# Patient Record
Sex: Male | Born: 1975 | Race: Black or African American | Hispanic: No | Marital: Single | Smoking: Current every day smoker
Health system: Southern US, Community
[De-identification: ages and names within clinical notes are randomized; demographics above are authoritative.]

## PROBLEM LIST (undated history)

## (undated) DIAGNOSIS — I10 Essential (primary) hypertension: Secondary | ICD-10-CM

## (undated) HISTORY — PX: FOOT SURGERY: SHX648

---

## 2008-11-23 ENCOUNTER — Emergency Department: Payer: Self-pay | Admitting: Emergency Medicine

## 2010-06-29 ENCOUNTER — Emergency Department: Payer: Self-pay | Admitting: Emergency Medicine

## 2013-12-23 ENCOUNTER — Emergency Department: Payer: Self-pay | Admitting: Emergency Medicine

## 2013-12-23 LAB — COMPREHENSIVE METABOLIC PANEL
ALT: 86 U/L — AB (ref 12–78)
Albumin: 3.8 g/dL (ref 3.4–5.0)
Alkaline Phosphatase: 86 U/L
Anion Gap: 5 — ABNORMAL LOW (ref 7–16)
BILIRUBIN TOTAL: 0.5 mg/dL (ref 0.2–1.0)
BUN: 11 mg/dL (ref 7–18)
CHLORIDE: 102 mmol/L (ref 98–107)
Calcium, Total: 9.1 mg/dL (ref 8.5–10.1)
Co2: 30 mmol/L (ref 21–32)
Creatinine: 0.87 mg/dL (ref 0.60–1.30)
Glucose: 117 mg/dL — ABNORMAL HIGH (ref 65–99)
OSMOLALITY: 274 (ref 275–301)
Potassium: 3.9 mmol/L (ref 3.5–5.1)
SGOT(AST): 112 U/L — ABNORMAL HIGH (ref 15–37)
SODIUM: 137 mmol/L (ref 136–145)
TOTAL PROTEIN: 7.8 g/dL (ref 6.4–8.2)

## 2013-12-23 LAB — CBC
HCT: 45.8 % (ref 40.0–52.0)
HGB: 14.8 g/dL (ref 13.0–18.0)
MCH: 31.3 pg (ref 26.0–34.0)
MCHC: 32.4 g/dL (ref 32.0–36.0)
MCV: 97 fL (ref 80–100)
Platelet: 177 10*3/uL (ref 150–440)
RBC: 4.74 10*6/uL (ref 4.40–5.90)
RDW: 15 % — AB (ref 11.5–14.5)
WBC: 7.3 10*3/uL (ref 3.8–10.6)

## 2013-12-23 LAB — URINALYSIS, COMPLETE
Bacteria: NONE SEEN
Bilirubin,UR: NEGATIVE
Blood: NEGATIVE
GLUCOSE, UR: NEGATIVE mg/dL (ref 0–75)
Ketone: NEGATIVE
Leukocyte Esterase: NEGATIVE
Nitrite: NEGATIVE
Ph: 6 (ref 4.5–8.0)
Protein: NEGATIVE
Specific Gravity: 1.025 (ref 1.003–1.030)
Squamous Epithelial: <1
WBC UR: NONE SEEN /HPF (ref 0–5)

## 2013-12-23 LAB — DRUG SCREEN, URINE
AMPHETAMINES, UR SCREEN: NEGATIVE (ref ?–1000)
BARBITURATES, UR SCREEN: NEGATIVE (ref ?–200)
Benzodiazepine, Ur Scrn: NEGATIVE (ref ?–200)
Cannabinoid 50 Ng, Ur ~~LOC~~: POSITIVE (ref ?–50)
Cocaine Metabolite,Ur ~~LOC~~: NEGATIVE (ref ?–300)
MDMA (Ecstasy)Ur Screen: NEGATIVE (ref ?–500)
Methadone, Ur Screen: NEGATIVE (ref ?–300)
Opiate, Ur Screen: NEGATIVE (ref ?–300)
Phencyclidine (PCP) Ur S: NEGATIVE (ref ?–25)
Tricyclic, Ur Screen: NEGATIVE (ref ?–1000)

## 2013-12-23 LAB — ACETAMINOPHEN LEVEL: Acetaminophen: <2 ug/mL

## 2013-12-23 LAB — SALICYLATE LEVEL: SALICYLATES, SERUM: 2.2 mg/dL

## 2013-12-23 LAB — ETHANOL

## 2014-07-08 ENCOUNTER — Emergency Department: Payer: Self-pay | Admitting: Emergency Medicine

## 2015-02-12 ENCOUNTER — Emergency Department
Admit: 2015-02-12 | Disposition: A | Discharge status: Home / Self Care | Payer: Self-pay | Admitting: Emergency Medicine

## 2015-03-09 ENCOUNTER — Encounter: Payer: Self-pay | Admitting: Urgent Care

## 2015-03-09 ENCOUNTER — Emergency Department
Admission: EM | Admit: 2015-03-09 | Discharge: 2015-03-09 | Disposition: A | Discharge status: Home / Self Care | Payer: Self-pay | Attending: Emergency Medicine | Admitting: Emergency Medicine

## 2015-03-09 DIAGNOSIS — R197 Diarrhea, unspecified: Secondary | ICD-10-CM

## 2015-03-09 DIAGNOSIS — K580 Irritable bowel syndrome with diarrhea: Secondary | ICD-10-CM | POA: Insufficient documentation

## 2015-03-09 DIAGNOSIS — K589 Irritable bowel syndrome without diarrhea: Secondary | ICD-10-CM

## 2015-03-09 DIAGNOSIS — Z720 Tobacco use: Secondary | ICD-10-CM | POA: Insufficient documentation

## 2015-03-09 DIAGNOSIS — R1084 Generalized abdominal pain: Secondary | ICD-10-CM

## 2015-03-09 DIAGNOSIS — I10 Essential (primary) hypertension: Secondary | ICD-10-CM | POA: Insufficient documentation

## 2015-03-09 HISTORY — DX: Essential (primary) hypertension: I10

## 2015-03-09 LAB — URINALYSIS COMPLETE WITH MICROSCOPIC (ARMC ONLY)
Bilirubin Urine: NEGATIVE
Glucose, UA: NEGATIVE mg/dL
Hgb urine dipstick: NEGATIVE
Ketones, ur: NEGATIVE mg/dL
LEUKOCYTES UA: NEGATIVE
Nitrite: NEGATIVE
PROTEIN: NEGATIVE mg/dL
Specific Gravity, Urine: 1.02 (ref 1.005–1.030)
pH: 6 (ref 5.0–8.0)

## 2015-03-09 LAB — COMPREHENSIVE METABOLIC PANEL
ALK PHOS: 45 U/L (ref 38–126)
ALT: 8 U/L — ABNORMAL LOW (ref 17–63)
ANION GAP: 7 (ref 5–15)
AST: 20 U/L (ref 15–41)
Albumin: 3.1 g/dL — ABNORMAL LOW (ref 3.5–5.0)
BUN: <5 mg/dL — ABNORMAL LOW (ref 6–20)
CO2: 31 mmol/L (ref 22–32)
CREATININE: 0.88 mg/dL (ref 0.61–1.24)
Calcium: 9 mg/dL (ref 8.9–10.3)
Chloride: 102 mmol/L (ref 101–111)
GFR calc Af Amer: >60 mL/min (ref >60)
GLUCOSE: 125 mg/dL — AB (ref 65–99)
Potassium: 2.6 mmol/L — CL (ref 3.5–5.1)
Sodium: 140 mmol/L (ref 135–145)
Total Bilirubin: 0.2 mg/dL — ABNORMAL LOW (ref 0.3–1.2)
Total Protein: 6.1 g/dL — ABNORMAL LOW (ref 6.5–8.1)

## 2015-03-09 LAB — LIPASE, BLOOD: Lipase: 26 U/L (ref 22–51)

## 2015-03-09 LAB — CBC WITH DIFFERENTIAL/PLATELET
BASOS PCT: 0 %
Basophils Absolute: 0 10*3/uL (ref 0–0.1)
Eosinophils Absolute: 0.2 10*3/uL (ref 0–0.7)
Eosinophils Relative: 2 %
HEMATOCRIT: 41.9 % (ref 40.0–52.0)
HEMOGLOBIN: 14.1 g/dL (ref 13.0–18.0)
LYMPHS ABS: 1.7 10*3/uL (ref 1.0–3.6)
LYMPHS PCT: 15 %
MCH: 32 pg (ref 26.0–34.0)
MCHC: 33.6 g/dL (ref 32.0–36.0)
MCV: 95.2 fL (ref 80.0–100.0)
MONO ABS: 2.1 10*3/uL — AB (ref 0.2–1.0)
MONOS PCT: 19 %
NEUTROS ABS: 7.1 10*3/uL — AB (ref 1.4–6.5)
Neutrophils Relative %: 64 %
Platelets: 352 10*3/uL (ref 150–440)
RBC: 4.4 MIL/uL (ref 4.40–5.90)
RDW: 14.1 % (ref 11.5–14.5)
WBC: 11.1 10*3/uL — ABNORMAL HIGH (ref 3.8–10.6)

## 2015-03-09 MED ORDER — HYDROCODONE-ACETAMINOPHEN 5-325 MG PO TABS: 1.0000 | ORAL_TABLET | ORAL | Status: DC | PRN

## 2015-03-09 MED ORDER — POTASSIUM CHLORIDE ER 10 MEQ PO TBCR: 10.0000 meq | EXTENDED_RELEASE_TABLET | Freq: Two times a day (BID) | ORAL | Status: DC

## 2015-03-09 MED ORDER — DICYCLOMINE HCL 10 MG PO CAPS
ORAL_CAPSULE | ORAL | Status: AC
  Administered 2015-03-09: 20 mg via ORAL
  Filled 2015-03-09: qty 2

## 2015-03-09 MED ORDER — DICYCLOMINE HCL 10 MG PO CAPS: 10.0000 mg | ORAL_CAPSULE | Freq: Three times a day (TID) | ORAL | Status: DC

## 2015-03-09 MED ORDER — HYDROCODONE-ACETAMINOPHEN 5-325 MG PO TABS
ORAL_TABLET | ORAL | Status: AC
  Filled 2015-03-09: qty 1

## 2015-03-09 MED ORDER — DICYCLOMINE HCL 10 MG PO CAPS
ORAL_CAPSULE | ORAL | Status: AC
  Filled 2015-03-09: qty 2

## 2015-03-09 MED ORDER — DICYCLOMINE HCL 10 MG PO CAPS
20.0000 mg | ORAL_CAPSULE | Freq: Once | ORAL | Status: AC
  Administered 2015-03-09: 20 mg via ORAL

## 2015-03-09 MED ORDER — HYDROCODONE-ACETAMINOPHEN 5-325 MG PO TABS
1.0000 | ORAL_TABLET | Freq: Once | ORAL | Status: AC
Start: 2015-03-09 — End: 2015-03-09
  Administered 2015-03-09: 1 via ORAL

## 2015-03-09 NOTE — Discharge Instructions (Signed)
Abdominal Pain Many things can cause abdominal pain. Usually, abdominal pain is not caused by a disease and will improve without treatment. It can often be observed and treated at home. Your health care provider will do a physical exam and possibly order blood tests and X-rays to help determine the seriousness of your pain. However, in many cases, more time must pass before a clear cause of the pain can be found. Before that point, your health care provider may not know if you need more testing or further treatment. HOME CARE INSTRUCTIONS  Monitor your abdominal pain for any changes. The following actions may help to alleviate any discomfort you are experiencing:  Only take over-the-counter or prescription medicines as directed by your health care provider.  Do not take laxatives unless directed to do so by your health care provider.  Try a clear liquid diet (broth, tea, or water) as directed by your health care provider. Slowly move to a bland diet as tolerated. SEEK MEDICAL CARE IF:  You have unexplained abdominal pain.  You have abdominal pain associated with nausea or diarrhea.  You have pain when you urinate or have a bowel movement.  You experience abdominal pain that wakes you in the night.  You have abdominal pain that is worsened or improved by eating food.  You have abdominal pain that is worsened with eating fatty foods.  You have a fever. SEEK IMMEDIATE MEDICAL CARE IF:   Your pain does not go away within 2 hours.  You keep throwing up (vomiting).  Your pain is felt only in portions of the abdomen, such as the right side or the left lower portion of the abdomen.  You pass bloody or black tarry stools. MAKE SURE YOU:  Understand these instructions.   Will watch your condition.   Will get help right away if you are not doing well or get worse.  Document Released: 07/12/2005 Document Revised: 10/07/2013 Document Reviewed: 06/11/2013 ExitCare Patient Information  2015 [employer]. This information is not intended to replace advice given to you by your health care provider. Make sure you discuss any questions you have with your health care provider.   Irritable Bowel Syndrome Irritable bowel syndrome (IBS) is caused by a disturbance of normal bowel function and is a common digestive disorder. You may also hear this condition called spastic colon, mucous colitis, and irritable colon. There is no cure for IBS. However, symptoms often gradually improve or disappear with a good diet, stress management, and medicine. This condition usually appears in late adolescence or early adulthood. Women develop it twice as often as men. CAUSES  After food has been digested and absorbed in the small intestine, waste material is moved into the large intestine, or colon. In the colon, water and salts are absorbed from the undigested products coming from the small intestine. The remaining residue, or fecal material, is held for elimination. Under normal circumstances, gentle, rhythmic contractions of the bowel walls push the fecal material along the colon toward the rectum. In IBS, however, these contractions are irregular and poorly coordinated. The fecal material is either retained too long, resulting in constipation, or expelled too soon, producing diarrhea. SIGNS AND SYMPTOMS  The most common symptom of IBS is abdominal pain. It is often in the lower left side of the abdomen, but it may occur anywhere in the abdomen. The pain comes from spasms of the bowel muscles happening too much and from the buildup of gas and fecal material in the colon.   pain:  Can range from sharp abdominal cramps to a dull, continuous ache.  Often worsens soon after eating.  Is often relieved by having a bowel movement or passing gas. Abdominal pain is usually accompanied by constipation, but it may also produce diarrhea. The diarrhea often occurs right after a meal or upon waking up in the  morning. The stools are often soft, watery, and flecked with mucus. Other symptoms of IBS include:  Bloating.  Loss of appetite.  Heartburn.  Backache.  Dull pain in the arms or shoulders.  Nausea.  Burping.  Vomiting.  Gas. IBS may also cause symptoms that are unrelated to the digestive system, such as:  Fatigue.  Headaches.  Anxiety.  Shortness of breath.  Trouble concentrating.  Dizziness. These symptoms tend to come and go. DIAGNOSIS  The symptoms of IBS may seem like symptoms of other, more serious digestive disorders. Your health care provider may want to perform tests to exclude these disorders.  TREATMENT Many medicines are available to help correct bowel function or relieve bowel spasms and abdominal pain. Among the medicines available are:  Laxatives for severe constipation and to help restore normal bowel habits.  Specific antidiarrheal medicines to treat severe or lasting diarrhea.  Antispasmodic agents to relieve intestinal cramps. Your health care provider may also decide to treat you with a mild tranquilizer or sedative during unusually stressful periods in your life. Your health care provider may also prescribe antidepressant medicine. The use of this medicine has been shown to reduce pain and other symptoms of IBS. Remember that if any medicine is prescribed for you, you should take it exactly as directed. Make sure your health care provider knows how well it worked for you. HOME CARE INSTRUCTIONS   Take all medicines as directed by your health care provider.  Avoid foods that are high in fat or oils, such as heavy cream, butter, frankfurters, sausage, and other fatty meats.  Avoid foods that make you go to the bathroom, such as fruit, fruit juice, and dairy products.  Cut out carbonated drinks, chewing gum, and "gassy" foods such as beans and cabbage. This may help relieve bloating and burping.  Eat foods with bran, and drink plenty of liquids  with the bran foods. This helps relieve constipation.  Keep track of what foods seem to bring on your symptoms.  Avoid emotionally charged situations or circumstances that produce anxiety.  Start or continue exercising.  Get plenty of rest and sleep. Document Released: 10/02/2005 Document Revised: 10/07/2013 Document Reviewed: 05/22/2008 [facility] Patient Information 2015 [place], [place]. This information is not intended to replace advice given to you by your health care provider. Make sure you discuss any questions you have with your health care provider.      As we have discussed please take your medications as prescribed. Please also taken over-the-counter fiber supplement such as Metamucil to help bulk your stools and decreased your diarrhea. Please follow-up with GI medicine at the number provided above if symptoms do not resolve within 3-5 days. Return to the emergency department for any increased pain, fever, or any other personally concerning symptoms.

## 2015-03-09 NOTE — ED Notes (Signed)
Stomach pain with diarrhea.off and on for about 1 mo

## 2015-03-09 NOTE — ED Notes (Signed)
Patient presents with c/o mid-abdominal pain x 1 month. (+) intermittent diarrhea. Patient denies N/V. Reports that he feels as if it is getting worse.

## 2015-03-09 NOTE — ED Provider Notes (Signed)
[facility] Emergency Department Provider Note  Time seen: 6:46 PM  I have reviewed the triage vital signs and the nursing notes.   HISTORY  Chief Complaint Abdominal Pain    HPI Emelia Loronheodore L Renegar is a 39 y.o. male with a past medical history of hypertension who presents the emergency department for abdominal pain and diarrhea. According to the patient the past 1 month he has had intermittent abdominal pains which she describes as a feeling that he has to immediately have a bowel movement. He goes to the bathroom a small amount of diarrhea will come out. Patient states this happens between 5-8 times per day, and has not been improving so he came to the ER. Denies any black/bloody stool. Denies nausea/vomiting, dysuria, fevers. Patient states between bowel movements he has absolutely no abdominal pain present since currently he has no abdominal pain.    Past Medical History  Diagnosis Date  . Hypertension     There are no active problems to display for this patient.   Past Surgical History  Procedure Laterality Date  . Foot surgery      No current outpatient prescriptions on file.  Allergies Shellfish allergy and Iodine  No family history on file.  Social History History  Substance Use Topics  . Smoking status: Current Every Day Smoker  . Smokeless tobacco: Not on file  . Alcohol Use: Yes    Review of Systems Constitutional: Negative for fever. Cardiovascular: Negative for chest pain. Respiratory: Negative for shortness of breath. Gastrointestinal: Intermittent abdominal pains, and diarrhea. No nausea or vomiting.. Genitourinary: Negative for dysuria. Neurological: Negative for headaches, focal weakness or numbness.  10-point ROS otherwise negative.  ____________________________________________   PHYSICAL EXAM:  VITAL SIGNS: ED Triage Vitals  Enc Vitals Group     BP 03/09/15 1824 125/89 mmHg     Pulse Rate 03/09/15 1824 72   Resp 03/09/15 1824 18     Temp 03/09/15 1824 98.7 F (37.1 C)     Temp Source 03/09/15 1824 Oral     SpO2 03/09/15 1824 100 %     Weight 03/09/15 1824 155 lb (70.308 kg)     Height 03/09/15 1824 6\' 2"  (1.88 m)     Head Cir --      Peak Flow --      Pain Score 03/09/15 1738 6     Pain Loc --      Pain Edu? --      Excl. in GC? --     Constitutional: Alert and oriented. Well appearing and in no distress. ENT   Mouth/Throat: Mucous membranes are moist. Cardiovascular: Normal rate, regular rhythm. No murmurs, rubs, or gallops. Respiratory: Normal respiratory effort without tachypnea nor retractions. Breath sounds are clear  Gastrointestinal: Soft and nontender. No distention.   Musculoskeletal: Nontender with normal range of motion in all extremities Neurologic:  Normal speech and language. No gross focal neurologic deficits Skin:  Skin is warm, dry and intact.  Psychiatric: Mood and affect are normal. Speech and behavior are normal.  ____________________________________________   INITIAL IMPRESSION / ASSESSMENT AND PLAN / ED COURSE  Pertinent labs & imaging results that were available during my care of the patient were reviewed by me and considered in my medical decision making (see chart for details).  Patient with 1 month of intermittent abdominal pains and loose bowel movements. Denies any straining, or hard stool. Patient's symptoms concerning for possible IBS. We will check labs, attempt to treat with Bentyl  for symptom relief, and monitor in the emergency department.   ----------------------------------------- 9:26 PM on 03/09/2015 -----------------------------------------  Labs show low potassium, likely secondary to diarrhea. We will place the patient on potassium supplements. I discussed with the patient over-the-counter Metamucil/fiber supplements debulk his stool, patient is agreeable to this plan. We will discharge on Bentyl, as well as as needed Norco. The patient  is a follow-up with a primary care doctor within one week, and is to return to the emergency department if symptoms worsen. Patient asking to be discharged home as he needs to pick up his mother, we will discharge him at this time.   ____________________________________________   FINAL CLINICAL IMPRESSION(S) / ED DIAGNOSES  Abdominal pain Diarrhea   Minna Antis, MD 03/09/15 2127

## 2015-10-26 ENCOUNTER — Other Ambulatory Visit: Payer: Self-pay

## 2015-10-26 ENCOUNTER — Emergency Department
Admission: EM | Admit: 2015-10-26 | Discharge: 2015-10-26 | Disposition: A | Discharge status: Home / Self Care | Payer: Self-pay | Attending: Emergency Medicine | Admitting: Emergency Medicine

## 2015-10-26 ENCOUNTER — Emergency Department: Payer: Self-pay

## 2015-10-26 DIAGNOSIS — F17200 Nicotine dependence, unspecified, uncomplicated: Secondary | ICD-10-CM | POA: Insufficient documentation

## 2015-10-26 DIAGNOSIS — F1210 Cannabis abuse, uncomplicated: Secondary | ICD-10-CM | POA: Insufficient documentation

## 2015-10-26 DIAGNOSIS — R42 Dizziness and giddiness: Secondary | ICD-10-CM | POA: Insufficient documentation

## 2015-10-26 DIAGNOSIS — I10 Essential (primary) hypertension: Secondary | ICD-10-CM | POA: Insufficient documentation

## 2015-10-26 DIAGNOSIS — R55 Syncope and collapse: Secondary | ICD-10-CM | POA: Insufficient documentation

## 2015-10-26 LAB — CBC
HEMATOCRIT: 46.5 % (ref 40.0–52.0)
Hemoglobin: 15.5 g/dL (ref 13.0–18.0)
MCH: 30 pg (ref 26.0–34.0)
MCHC: 33.4 g/dL (ref 32.0–36.0)
MCV: 89.9 fL (ref 80.0–100.0)
PLATELETS: 339 10*3/uL (ref 150–440)
RBC: 5.17 MIL/uL (ref 4.40–5.90)
RDW: 14.6 % — ABNORMAL HIGH (ref 11.5–14.5)
WBC: 6 10*3/uL (ref 3.8–10.6)

## 2015-10-26 LAB — BASIC METABOLIC PANEL
Anion gap: 7 (ref 5–15)
BUN: 11 mg/dL (ref 6–20)
CALCIUM: 9.6 mg/dL (ref 8.9–10.3)
CO2: 34 mmol/L — ABNORMAL HIGH (ref 22–32)
CREATININE: 0.86 mg/dL (ref 0.61–1.24)
Chloride: 102 mmol/L (ref 101–111)
GFR calc Af Amer: >60 mL/min (ref >60)
GLUCOSE: 68 mg/dL (ref 65–99)
Potassium: 4 mmol/L (ref 3.5–5.1)
SODIUM: 143 mmol/L (ref 135–145)

## 2015-10-26 LAB — URINALYSIS COMPLETE WITH MICROSCOPIC (ARMC ONLY)
BILIRUBIN URINE: NEGATIVE
Bacteria, UA: NONE SEEN
GLUCOSE, UA: NEGATIVE mg/dL
HGB URINE DIPSTICK: NEGATIVE
Ketones, ur: NEGATIVE mg/dL
Leukocytes, UA: NEGATIVE
NITRITE: NEGATIVE
Protein, ur: NEGATIVE mg/dL
Specific Gravity, Urine: 1.02 (ref 1.005–1.030)
Squamous Epithelial / LPF: NONE SEEN
pH: 7 (ref 5.0–8.0)

## 2015-10-26 LAB — TROPONIN I: Troponin I: 0.04 ng/mL — ABNORMAL HIGH (ref <0.031)

## 2015-10-26 LAB — URINE DRUG SCREEN, QUALITATIVE (ARMC ONLY)
AMPHETAMINES, UR SCREEN: NOT DETECTED
Barbiturates, Ur Screen: NOT DETECTED
Benzodiazepine, Ur Scrn: NOT DETECTED
COCAINE METABOLITE, UR ~~LOC~~: NOT DETECTED
Cannabinoid 50 Ng, Ur ~~LOC~~: POSITIVE — AB
MDMA (Ecstasy)Ur Screen: NOT DETECTED
Methadone Scn, Ur: NOT DETECTED
OPIATE, UR SCREEN: NOT DETECTED
Phencyclidine (PCP) Ur S: NOT DETECTED
Tricyclic, Ur Screen: NOT DETECTED

## 2015-10-26 NOTE — Discharge Instructions (Signed)

## 2015-10-26 NOTE — ED Provider Notes (Signed)
[facility]     Time seen: ----------------------------------------- 12:06 PM on 10/26/2015 -----------------------------------------    I have reviewed the triage vital signs and the nursing notes.   HISTORY  Chief Complaint Dizziness and Loss of Consciousness    HPI Eduardo King is a 40 y.o. male who presents to ER for intermittent dizziness and syncopal episodes. Patient has had several events where he is completely at rest and not exerting himself and he subsequently passes out. Patient denies any recent illness, states he has a history of IBS. Patient states he exercises normally without any dizziness, shortness of breath or palpitations.   Past Medical History  Diagnosis Date  . Hypertension     There are no active problems to display for this patient.   Past Surgical History  Procedure Laterality Date  . Foot surgery      Allergies Shellfish allergy and Iodine  Social History Social History  Substance Use Topics  . Smoking status: Current Every Day Smoker  . Smokeless tobacco: None  . Alcohol Use: Yes    Review of Systems Constitutional: Negative for fever. Eyes: Negative for visual changes. ENT: Negative for sore throat. Cardiovascular: Negative for chest pain. Respiratory: Negative for shortness of breath. Gastrointestinal: Negative for abdominal pain, vomiting and diarrhea. Genitourinary: Negative for dysuria. Musculoskeletal: Negative for back pain. Skin: Negative for rash. Neurological: Negative for headaches, focal weakness or numbness.  10-point ROS otherwise negative.  ____________________________________________   PHYSICAL EXAM:  VITAL SIGNS: ED Triage Vitals  Enc Vitals Group     BP 10/26/15 1005 141/91 mmHg     Pulse Rate 10/26/15 1005 78     Resp 10/26/15 1005 16     Temp 10/26/15 1005 98.2 F (36.8 C)     Temp Source 10/26/15 1005 Oral     SpO2 10/26/15 1005  99 %     Weight 10/26/15 1005 155 lb (70.308 kg)     Height 10/26/15 1005 6\' 2"  (1.88 m)     Head Cir --      Peak Flow --      Pain Score 10/26/15 1006 0     Pain Loc --      Pain Edu? --      Excl. in GC? --     Constitutional: Alert and oriented. Well appearing and in no distress. Eyes: Conjunctivae are normal. PERRL. Normal extraocular movements. ENT   Head: Normocephalic and atraumatic.   Nose: No congestion/rhinnorhea.   Mouth/Throat: Mucous membranes are moist.   Neck: No stridor. Cardiovascular: Normal rate, regular rhythm. Normal and symmetric distal pulses are present in all extremities. No murmurs, rubs, or gallops. Respiratory: Normal respiratory effort without tachypnea nor retractions. Breath sounds are clear and equal bilaterally. No wheezes/rales/rhonchi. Gastrointestinal: Soft and nontender. No distention. No abdominal bruits.  Musculoskeletal: Nontender with normal range of motion in all extremities. No joint effusions.  No lower extremity tenderness nor edema. Neurologic:  Normal speech and language. No gross focal neurologic deficits are appreciated. Speech is normal. No gait instability. Skin:  Skin is warm, dry and intact. No rash noted. Psychiatric: Mood and affect are normal. Speech and behavior are normal. Patient exhibits appropriate insight and judgment. ____________________________________________  EKG: Interpreted by me. Normal sinus rhythm with a rate of 73 bpm, normal PR interval, normal QRS, normal QT interval. Possible LVH.  ____________________________________________  ED COURSE:  Pertinent labs & imaging results that were available during my care of the patient  were reviewed by me and considered in my medical decision making (see chart for details). Patient with nonspecific syncopal events. We'll check basic labs were evaluated. ____________________________________________    LABS (pertinent positives/negatives)  Labs Reviewed   BASIC METABOLIC PANEL - Abnormal; Notable for the following:    CO2 34 (*)    All other components within normal limits  CBC - Abnormal; Notable for the following:    RDW 14.6 (*)    All other components within normal limits  URINALYSIS COMPLETEWITH MICROSCOPIC ([facility] ONLY) - Abnormal; Notable for the following:    Color, Urine YELLOW (*)    APPearance CLEAR (*)    All other components within normal limits  URINE DRUG SCREEN, QUALITATIVE ([facility] ONLY) - Abnormal; Notable for the following:    Cannabinoid 50 Ng, Ur [place] POSITIVE (*)    All other components within normal limits  TROPONIN I - Abnormal; Notable for the following:    Troponin I 0.04 (*)    All other components within normal limits    RADIOLOGY Images were viewed by me  Chest x-ray  IMPRESSION: Negative portable chest. ____________________________________________  FINAL ASSESSMENT AND PLAN  Syncope  Plan: Patient with labs and imaging as dictated above. Patient with history of syncopal events. He currently feels fine, troponin slightly elevated. I discussed with she context, who wants to perform echocardiogram in the office. Patient agrees with plan, will go to [facility] office for further evaluation at this time.   Emily Filbert, MD   Emily Filbert, MD 10/26/15 [phone]

## 2015-10-26 NOTE — ED Notes (Signed)
Pt c/o intermittent dizziness, states he has a syncopal episode over the weekend.. States he has intermittent HA, denies dizziness or HA today..Marland Kitchen

## 2016-04-13 ENCOUNTER — Emergency Department
Admission: EM | Admit: 2016-04-13 | Discharge: 2016-04-13 | Disposition: A | Discharge status: Home / Self Care | Payer: Self-pay | Attending: Emergency Medicine | Admitting: Emergency Medicine

## 2016-04-13 DIAGNOSIS — Z91013 Allergy to seafood: Secondary | ICD-10-CM | POA: Insufficient documentation

## 2016-04-13 DIAGNOSIS — Z79899 Other long term (current) drug therapy: Secondary | ICD-10-CM | POA: Insufficient documentation

## 2016-04-13 DIAGNOSIS — I10 Essential (primary) hypertension: Secondary | ICD-10-CM | POA: Insufficient documentation

## 2016-04-13 DIAGNOSIS — F17200 Nicotine dependence, unspecified, uncomplicated: Secondary | ICD-10-CM | POA: Insufficient documentation

## 2016-04-13 DIAGNOSIS — H109 Unspecified conjunctivitis: Secondary | ICD-10-CM | POA: Insufficient documentation

## 2016-04-13 DIAGNOSIS — H1033 Unspecified acute conjunctivitis, bilateral: Secondary | ICD-10-CM

## 2016-04-13 MED ORDER — POLYMYXIN B-TRIMETHOPRIM 10000-0.1 UNIT/ML-% OP SOLN: 2.0000 [drp] | Freq: Four times a day (QID) | OPHTHALMIC | Status: DC

## 2016-04-13 NOTE — ED Notes (Signed)
Pt reports that he bilat eyes with pain and itching - eyes having been draining clear to yellow mucus that he has been cleaning off all day

## 2016-04-13 NOTE — ED Notes (Signed)
Pt in with co bilat eye itching no redness noted at this time.

## 2016-04-13 NOTE — ED Provider Notes (Signed)
[facility] Emergency Department Provider Note  ____________________________________________  Time seen: Approximately 7:53 PM  I have reviewed the triage vital signs and the nursing notes.   HISTORY  Chief Complaint Eye Problem    HPI Eduardo King is a 40 y.o. male who presents emergency department complaining of bilateral eye redness, itching, pain, and purulent drainage. Patient states that symptoms have been ongoing 1 day. Patient denies any known contact with other people with pink eye. Patient denies any nasal congestion, ear pain, sore throat. He has not tried any medications for this complaint prior to arrival. Patient does not wear glasses or contacts. He denies any trauma to eyes.   Past Medical History  Diagnosis Date  . Hypertension     There are no active problems to display for this patient.   Past Surgical History  Procedure Laterality Date  . Foot surgery      Current Outpatient Rx  Name  Route  Sig  Dispense  Refill  . EXPIRED: dicyclomine (BENTYL) 10 MG capsule   Oral   Take 1 capsule (10 mg total) by mouth 3 (three) times daily before meals.   56 capsule   0   . HYDROcodone-acetaminophen (NORCO/VICODIN) 5-325 MG per tablet   Oral   Take 1 tablet by mouth every 4 (four) hours as needed for moderate pain.   15 tablet   0   . potassium chloride (K-DUR) 10 MEQ tablet   Oral   Take 1 tablet (10 mEq total) by mouth 2 (two) times daily.   60 tablet   0   . trimethoprim-polymyxin b (POLYTRIM) ophthalmic solution   Both Eyes   Place 2 drops into both eyes every 6 (six) hours.   10 mL   0     Allergies Shellfish allergy and Iodine  No family history on file.  Social History Social History  Substance Use Topics  . Smoking status: Current Every Day Smoker  . Smokeless tobacco: Not on file  . Alcohol Use: Yes     Review of Systems  Constitutional: No fever/chills Eyes: No visual changes.Positive for  conjunctival erythema. Positive urine discharge ENT: No upper respiratory complaints. Cardiovascular: no chest pain. Respiratory: no cough. No SOB. Gastrointestinal: No abdominal pain.  No nausea, no vomiting.   Musculoskeletal: Negative for musculoskeletal pain. Skin: Negative for rash, abrasions, lacerations, ecchymosis. Neurological: Negative for headaches, focal weakness or numbness. 10-point ROS otherwise negative.  ____________________________________________   PHYSICAL EXAM:  VITAL SIGNS: ED Triage Vitals  Enc Vitals Group     BP 04/13/16 1915 183/109 mmHg     Pulse Rate 04/13/16 1914 80     Resp 04/13/16 1914 18     Temp 04/13/16 1914 98.4 F (36.9 C)     Temp Source 04/13/16 1914 Oral     SpO2 04/13/16 1914 95 %     Weight 04/13/16 1914 155 lb (70.308 kg)     Height 04/13/16 1914 6\' 2"  (1.88 m)     Head Cir --      Peak Flow --      Pain Score 04/13/16 1932 9     Pain Loc --      Pain Edu? --      Excl. in GC? --      Constitutional: Alert and oriented. Well appearing and in no acute distress. Eyes: Conjunctivae are Erythematous. Purulent drainage is noted bilateral lower eyelashes. Funduscopic exam reveals good red reflex, vasculature and optic disc are  unremarkable for acute abnormality.Marland Kitchen. PERRL. EOMI. Head: Atraumatic. ENT:      Ears: He sees and TMs are unremarkable bilaterally      Nose: No congestion/rhinnorhea.      Mouth/Throat: Mucous membranes are moist.  Neck: No stridor.    Cardiovascular: Normal rate, regular rhythm. Normal S1 and S2.  Good peripheral circulation. Respiratory: Normal respiratory effort without tachypnea or retractions. Lungs CTAB. Good air entry to the bases with no decreased or absent breath sounds. Musculoskeletal: Full range of motion to all extremities. No gross deformities appreciated. Neurologic:  Normal speech and language. No gross focal neurologic deficits are appreciated.  Skin:  Skin is warm, dry and intact. No rash  noted. Psychiatric: Mood and affect are normal. Speech and behavior are normal. Patient exhibits appropriate insight and judgement.   ____________________________________________   LABS (all labs ordered are listed, but only abnormal results are displayed)  Labs Reviewed - No data to display ____________________________________________  EKG   ____________________________________________  RADIOLOGY   No results found.  ____________________________________________    PROCEDURES  Procedure(s) performed:       Medications - No data to display   ____________________________________________   INITIAL IMPRESSION / ASSESSMENT AND PLAN / ED COURSE  Pertinent labs & imaging results that were available during my care of the patient were reviewed by me and considered in my medical decision making (see chart for details).  Patient's diagnosis is consistent with acute bacterial conjunctivitis bilaterally.. Patient will be discharged home with prescriptions for antibiotic eyedrops. Patient is to follow up with primary care provider as needed or otherwise directed. Patient is given ED precautions to return to the ED for any worsening or new symptoms.     ____________________________________________  FINAL CLINICAL IMPRESSION(S) / ED DIAGNOSES  Final diagnoses:  Acute bacterial conjunctivitis of both eyes      NEW MEDICATIONS STARTED DURING THIS VISIT:  Discharge Medication List as of 04/13/2016  7:47 PM    START taking these medications   Details  trimethoprim-polymyxin b (POLYTRIM) ophthalmic solution Place 2 drops into both eyes every 6 (six) hours., Starting 04/13/2016, Until Discontinued, Print            This chart was dictated using voice recognition software/Dragon. Despite best efforts to proofread, errors can occur which can change the meaning. Any change was purely unintentional.    Racheal PatchesJonathan D Avabella Wailes, PA-C 04/13/16 2001  Minna AntisKevin Paduchowski,  MD 04/14/16 2255

## 2016-04-13 NOTE — Discharge Instructions (Signed)
Bacterial Conjunctivitis °Bacterial conjunctivitis, commonly called pink eye, is an inflammation of the clear membrane that covers the white part of the eye (conjunctiva). The inflammation can also happen on the underside of the eyelids. The blood vessels in the conjunctiva become inflamed, causing the eye to become red or pink. Bacterial conjunctivitis may spread easily from one eye to another and from person to person (contagious).  °CAUSES  °Bacterial conjunctivitis is caused by bacteria. The bacteria may come from your own skin, your upper respiratory tract, or from someone else with bacterial conjunctivitis. °SYMPTOMS  °The normally white color of the eye or the underside of the eyelid is usually pink or red. The pink eye is usually associated with irritation, tearing, and some sensitivity to light. Bacterial conjunctivitis is often associated with a thick, yellowish discharge from the eye. The discharge may turn into a crust on the eyelids overnight, which causes your eyelids to stick together. If a discharge is present, there may also be some blurred vision in the affected eye. °DIAGNOSIS  °Bacterial conjunctivitis is diagnosed by your caregiver through an eye exam and the symptoms that you report. Your caregiver looks for changes in the surface tissues of your eyes, which may point to the specific type of conjunctivitis. A sample of any discharge may be collected on a cotton-tip swab if you have a severe case of conjunctivitis, if your cornea is affected, or if you keep getting repeat infections that do not respond to treatment. The sample will be sent to a lab to see if the inflammation is caused by a bacterial infection and to see if the infection will respond to antibiotic medicines. °TREATMENT  °1. Bacterial conjunctivitis is treated with antibiotics. Antibiotic eyedrops are most often used. However, antibiotic ointments are also available. Antibiotics pills are sometimes used. Artificial tears or eye  washes may ease discomfort. °HOME CARE INSTRUCTIONS  °1. To ease discomfort, apply a cool, clean washcloth to your eye for 10-20 minutes, 3-4 times a day. °2. Gently wipe away any drainage from your eye with a warm, wet washcloth or a cotton ball. °3. Wash your hands often with soap and water. Use paper towels to dry your hands. °4. Do not share towels or washcloths. This may spread the infection. °5. Change or wash your pillowcase every day. °6. You should not use eye makeup until the infection is gone. °7. Do not operate machinery or drive if your vision is blurred. °8. Stop using contact lenses. Ask your caregiver how to sterilize or replace your contacts before using them again. This depends on the type of contact lenses that you use. °9. When applying medicine to the infected eye, do not touch the edge of your eyelid with the eyedrop bottle or ointment tube. °SEEK IMMEDIATE MEDICAL CARE IF:  °· Your infection has not improved within 3 days after beginning treatment. °· You had yellow discharge from your eye and it returns. °· You have increased eye pain. °· Your eye redness is spreading. °· Your vision becomes blurred. °· You have a fever or persistent symptoms for more than 2-3 days. °· You have a fever and your symptoms suddenly get worse. °· You have facial pain, redness, or swelling. °MAKE SURE YOU:  °· Understand these instructions. °· Will watch your condition. °· Will get help right away if you are not doing well or get worse. °  °This information is not intended to replace advice given to you by your health care provider. Make sure you   discuss any questions you have with your health care provider. °  °Document Released: 10/02/2005 Document Revised: 10/23/2014 Document Reviewed: 03/04/2012 °Elsevier Interactive Patient Education ©2016 [employer] ° °How to Use Eye Drops and Eye Ointments °HOW TO APPLY EYE DROPS °Follow these steps when applying eye drops: °2. Wash your hands. °3. Tilt your head  back. °4. Put a finger under your eye and use it to gently pull your lower lid downward. Keep that finger in place. °5. Using your other hand, hold the dropper between your thumb and index finger. °6. Position the dropper just over the edge of the lower lid. Hold it as close to your eye as you can without touching the dropper to your eye. °7. Steady your hand. One way to do this is to lean your index finger against your brow. °8. Look up. °9. Slowly and gently squeeze one drop of medicine into your eye. °10. Close your eye. °11. Place a finger between your lower eyelid and your nose. Press gently for 2 minutes. This increases the amount of time that the medicine is exposed to the eye. It also reduces side effects that can develop if the drop gets into the bloodstream through the nose. °HOW TO APPLY EYE OINTMENTS °Follow these steps when applying eye ointments: °10. Wash your hands. °11. Put a finger under your eye and use it to gently pull your lower lid downward. Keep that finger in place. °12. Using your other hand, place the tip of the tube between your thumb and index finger with the remaining fingers braced against your cheek or nose. °13. Hold the tube just over the edge of your lower lid without touching the tube to your lid or eyeball. °14. Look up. °15. Line the inner part of your lower lid with ointment. °16. Gently pull up on your upper lid and look down. This will force the ointment to spread over the surface of the eye. °17. Release the upper lid. °18. If you can, close your eyes for 1-2 minutes. °Do not rub your eyes. If you applied the ointment correctly, your vision will be blurry for a few minutes. This is normal. °ADDITIONAL INFORMATION °· Make sure to use the eye drops or ointment as told by your health care provider. °· If you have been told to use both eye drops and an eye ointment, apply the eye drops first, then wait 3-4 minutes before you apply the ointment. °· Try not to touch the tip of the  dropper or tube to your eye. A dropper or tube that has touched the eye can become contaminated. °  °This information is not intended to replace advice given to you by your health care provider. Make sure you discuss any questions you have with your health care provider. °  °Document Released: 01/08/2001 Document Revised: 02/16/2015 Document Reviewed: 09/28/2014 °Elsevier Interactive Patient Education ©2016 [employer] ° °

## 2018-08-22 ENCOUNTER — Encounter: Payer: Self-pay | Admitting: Emergency Medicine

## 2018-08-22 ENCOUNTER — Other Ambulatory Visit: Payer: Self-pay

## 2018-08-22 ENCOUNTER — Emergency Department
Admission: EM | Admit: 2018-08-22 | Discharge: 2018-08-22 | Disposition: A | Discharge status: Home / Self Care | Payer: Self-pay | Attending: Emergency Medicine | Admitting: Emergency Medicine

## 2018-08-22 DIAGNOSIS — G43909 Migraine, unspecified, not intractable, without status migrainosus: Secondary | ICD-10-CM

## 2018-08-22 DIAGNOSIS — I10 Essential (primary) hypertension: Secondary | ICD-10-CM | POA: Insufficient documentation

## 2018-08-22 DIAGNOSIS — F17200 Nicotine dependence, unspecified, uncomplicated: Secondary | ICD-10-CM | POA: Insufficient documentation

## 2018-08-22 DIAGNOSIS — G43009 Migraine without aura, not intractable, without status migrainosus: Secondary | ICD-10-CM | POA: Insufficient documentation

## 2018-08-22 MED ORDER — BUTALBITAL-APAP-CAFFEINE 50-325-40 MG PO TABS
1.0000 | ORAL_TABLET | Freq: Once | ORAL | Status: AC
Start: 2018-08-22 — End: 2018-08-22
  Administered 2018-08-22: 1 via ORAL
  Filled 2018-08-22: qty 1

## 2018-08-22 MED ORDER — BUTALBITAL-APAP-CAFFEINE 50-325-40 MG PO TABS: 1.0000 | ORAL_TABLET | Freq: Four times a day (QID) | ORAL | 0 refills | Status: AC | PRN

## 2018-08-22 NOTE — ED Triage Notes (Signed)
Pt ambulated with a steady gait to triage room. PT a & o x 4. Pt reports headache that started yesterday and unrelieved with OTC medication. PT reports sensitivity to light/sound.

## 2018-08-22 NOTE — ED Provider Notes (Signed)
[facility] Emergency Department Provider Note   ____________________________________________    I have reviewed the triage vital signs and the nursing notes.   HISTORY  Chief Complaint Headache     HPI Eduardo King is a 42 y.o. male who presents with complaints of a headache.  Patient reports a history of migraines, he states actually he is feeling significantly better now.  Denies neuro deficits.  He did have photophobia.  No neck pain or fevers or chills.  This symptoms were similar to prior episodes, he reports his PCP wrote him a prescription in the past but he did not get it filled.     Past Medical History:  Diagnosis Date  . Hypertension     There are no active problems to display for this patient.   Past Surgical History:  Procedure Laterality Date  . FOOT SURGERY      Prior to Admission medications   Medication Sig Start Date End Date Taking? Authorizing Provider  butalbital-acetaminophen-caffeine (FIORICET, ESGIC) 50-325-40 MG tablet Take 1-2 tablets by mouth every 6 (six) hours as needed for headache. 08/22/18 08/22/19  Jene Every, MD  dicyclomine (BENTYL) 10 MG capsule Take 1 capsule (10 mg total) by mouth 3 (three) times daily before meals. 03/09/15 03/23/15  Minna Antis, MD  HYDROcodone-acetaminophen (NORCO/VICODIN) 5-325 MG per tablet Take 1 tablet by mouth every 4 (four) hours as needed for moderate pain. 03/09/15   Minna Antis, MD  potassium chloride (K-DUR) 10 MEQ tablet Take 1 tablet (10 mEq total) by mouth 2 (two) times daily. 03/09/15   Minna Antis, MD  trimethoprim-polymyxin b (POLYTRIM) ophthalmic solution Place 2 drops into both eyes every 6 (six) hours. 04/13/16   Cuthriell, Delorise Royals, PA-C     Allergies Shellfish allergy and Iodine  No family history on file.  Social History Social History   Tobacco Use  . Smoking status: Current Every Day Smoker  Substance Use Topics  . Alcohol use:  Yes  . Drug use: No    Review of Systems  Constitutional: No fever/chills Eyes: Photophobia ENT: No sore throat. Cardiovascular: No palpitations Respiratory: Denies shortness of breath. Gastrointestinal: No abdominal pain.  Genitourinary: Negative for dysuria. Musculoskeletal: Negative for back pain. Skin: Negative for rash. Neurological: Negative for headaches    ____________________________________________   PHYSICAL EXAM:  VITAL SIGNS: ED Triage Vitals  Enc Vitals Group     BP 08/22/18 1215 116/72     Pulse Rate 08/22/18 1215 93     Resp 08/22/18 1215 18     Temp 08/22/18 1215 98.4 F (36.9 C)     Temp Source 08/22/18 1215 Oral     SpO2 08/22/18 1215 98 %     Weight 08/22/18 1216 70.8 kg (156 lb)     Height 08/22/18 1216 1.88 m (6\' 2" )     Head Circumference --      Peak Flow --      Pain Score 08/22/18 1216 8     Pain Loc --      Pain Edu? --      Excl. in [place]? --     Constitutional: Alert and oriented. Eyes: Conjunctivae are normal.  EOMI, PERRLA  Nose: No congestion/rhinnorhea. Mouth/Throat: Mucous membranes are moist.    Cardiovascular: Normal rate, regular rhythm. Grossly normal heart sounds.  Good peripheral circulation. Respiratory: Normal respiratory effort.  No retractions. Lungs CTAB. Gastrointestinal: Soft and nontender. No distention.   Musculoskeletal: No lower extremity tenderness nor edema.  Warm and well perfused Neurologic:  Normal speech and language. No gross focal neurologic deficits are appreciated.  Skin:  Skin is warm, dry and intact. No rash noted. Psychiatric: Mood and affect are normal. Speech and behavior are normal.  ____________________________________________   LABS (all labs ordered are listed, but only abnormal results are displayed)  Labs Reviewed - No data to  display ____________________________________________  EKG  None ____________________________________________  RADIOLOGY  None ____________________________________________   PROCEDURES  Procedure(s) performed: No  Procedures   Critical Care performed: No ____________________________________________   INITIAL IMPRESSION / ASSESSMENT AND PLAN / ED COURSE  Pertinent labs & imaging results that were available during my care of the patient were reviewed by me and considered in my medical decision making (see chart for details).  Patient presents with complaints of migraine however symptoms have mostly abated at this time.  He is well-appearing in no acute distress.  Normal neuro exam.  No indication for imaging as he has had these symptoms before and these were similar today.  Vital signs unremarkable.  Will treat with p.o. Fioricet, patient has a ride home.  Outpatient follow-up, conservative management.  Return precautions discussed    ____________________________________________   FINAL CLINICAL IMPRESSION(S) / ED DIAGNOSES  Final diagnoses:  Migraine without status migrainosus, not intractable, unspecified migraine type        Note:  This document was prepared using Dragon voice recognition software and may include unintentional dictation errors.    Jene Every, MD 08/22/18 [phone]

## 2019-12-30 ENCOUNTER — Encounter: Payer: Self-pay | Admitting: Emergency Medicine

## 2019-12-30 ENCOUNTER — Emergency Department
Admission: EM | Admit: 2019-12-30 | Discharge: 2019-12-30 | Disposition: A | Discharge status: Home / Self Care | Payer: Commercial Managed Care - PPO | Attending: Emergency Medicine | Admitting: Emergency Medicine

## 2019-12-30 ENCOUNTER — Other Ambulatory Visit: Payer: Self-pay

## 2019-12-30 DIAGNOSIS — Z79899 Other long term (current) drug therapy: Secondary | ICD-10-CM | POA: Diagnosis not present

## 2019-12-30 DIAGNOSIS — R040 Epistaxis: Secondary | ICD-10-CM | POA: Insufficient documentation

## 2019-12-30 DIAGNOSIS — I10 Essential (primary) hypertension: Secondary | ICD-10-CM | POA: Diagnosis not present

## 2019-12-30 DIAGNOSIS — F17210 Nicotine dependence, cigarettes, uncomplicated: Secondary | ICD-10-CM | POA: Insufficient documentation

## 2019-12-30 MED ORDER — AMLODIPINE BESYLATE 2.5 MG PO TABS: 2.5000 mg | ORAL_TABLET | Freq: Every day | ORAL | 0 refills | Status: DC

## 2019-12-30 MED ORDER — AMLODIPINE BESYLATE 5 MG PO TABS
2.5000 mg | ORAL_TABLET | Freq: Once | ORAL | Status: AC
  Administered 2019-12-30: 2.5 mg via ORAL
  Filled 2019-12-30: qty 1

## 2019-12-30 NOTE — ED Triage Notes (Signed)
Patient ambulatory to triage with steady gait, without difficulty or distress noted, mask in place; pt reports awoke from a nap PTA with nosebleed; clamp in place, no bleeding at present; pt denies any recent illness, denies any c/o or accomp symptoms; denies hx of same

## 2019-12-30 NOTE — ED Notes (Signed)
See triage note, pt states he woke up from a nap with a nosebleed. Bleeding controlled at this time. Denies blood thinner use. Reports HTN history.  Pt in NAD at this time

## 2019-12-30 NOTE — ED Provider Notes (Signed)
[facility] Emergency Department Provider Note ____________________________________________   First MD Initiated Contact with Patient 12/30/19 2042     (approximate)  I have reviewed the triage vital signs and the nursing notes.   HISTORY  Chief Complaint Epistaxis  HPI Eduardo King is a 44 y.o. male with a history of hypertension presents to the emergency department for treatment of epistaxis.  He states that he ate an entire jar of pickles and then fell asleep.  When he awoke, he noticed that there was blood on his face.  He did not see any blood around him or on his close so he assumes that it just had started bleeding.  He said it bled for approximately 20 minutes but has since stopped.  He had been prescribed medication for hypertension but has not been taking it.         Past Medical History:  Diagnosis Date  . Hypertension     There are no problems to display for this patient.   Past Surgical History:  Procedure Laterality Date  . FOOT SURGERY      Prior to Admission medications   Medication Sig Start Date End Date Taking? Authorizing Provider  amLODipine (NORVASC) 2.5 MG tablet Take 1 tablet (2.5 mg total) by mouth daily. 12/30/19 12/29/20  Randa Riss, Dessa Phi, FNP  dicyclomine (BENTYL) 10 MG capsule Take 1 capsule (10 mg total) by mouth 3 (three) times daily before meals. 03/09/15 03/23/15  Harvest Dark, MD  HYDROcodone-acetaminophen (NORCO/VICODIN) 5-325 MG per tablet Take 1 tablet by mouth every 4 (four) hours as needed for moderate pain. 03/09/15   Harvest Dark, MD  potassium chloride (K-DUR) 10 MEQ tablet Take 1 tablet (10 mEq total) by mouth 2 (two) times daily. 03/09/15   Harvest Dark, MD  trimethoprim-polymyxin b (POLYTRIM) ophthalmic solution Place 2 drops into both eyes every 6 (six) hours. 04/13/16   Cuthriell, Charline Bills, PA-C    Allergies Shellfish allergy and Iodine  No family history on file.  Social  History Social History   Tobacco Use  . Smoking status: Current Every Day Smoker  . Smokeless tobacco: Never Used  Substance Use Topics  . Alcohol use: Yes    Comment: "beer on the weekends"  . Drug use: No    Review of Systems  Constitutional: No fever/chills Eyes: No visual changes. ENT: No sore throat.  Positive for epistaxis. Cardiovascular: Denies chest pain. Respiratory: Denies shortness of breath. Gastrointestinal: No abdominal pain.  No nausea, no vomiting.  No diarrhea.  No constipation. Genitourinary: Negative for dysuria. Musculoskeletal: Negative for back pain. Skin: Negative for rash. Neurological: Negative for headaches, focal weakness or numbness. ____________________________________________   PHYSICAL EXAM:  VITAL SIGNS: ED Triage Vitals  Enc Vitals Group     BP 12/30/19 2003 (!) 159/106     Pulse Rate 12/30/19 2003 79     Resp 12/30/19 2003 18     Temp 12/30/19 2003 98.8 F (37.1 C)     Temp Source 12/30/19 2003 Oral     SpO2 12/30/19 2003 98 %     Weight 12/30/19 1959 155 lb (70.3 kg)     Height 12/30/19 1959 6\' 2"  (1.88 m)     Head Circumference --      Peak Flow --      Pain Score 12/30/19 1958 0     Pain Loc --      Pain Edu? --      Excl. in [place]? --  Constitutional: Alert and oriented. Well appearing and in no acute distress. Eyes: Conjunctivae are normal. PERRL. EOMI. Head: Atraumatic. Nose: Evidence of anterior epistaxis in the left nostril which has now stopped.. Mouth/Throat: No blood noted in the posterior oropharynx.  Mucous membranes are moist.  Oropharynx non-erythematous. Neck: No stridor.   Hematological/Lymphatic/Immunilogical: No cervical lymphadenopathy. Cardiovascular: Normal rate, regular rhythm. Grossly normal heart sounds.  Good peripheral circulation. Respiratory: Normal respiratory effort.  No retractions. Lungs CTAB. Gastrointestinal: Soft and nontender. No distention. No abdominal bruits. No CVA  tenderness. Genitourinary:  Musculoskeletal: No lower extremity tenderness nor edema.  No joint effusions. Neurologic:  Normal speech and language. No gross focal neurologic deficits are appreciated. No gait instability. Skin:  Skin is warm, dry and intact. No rash noted. Psychiatric: Mood and affect are normal. Speech and behavior are normal.  ____________________________________________   LABS (all labs ordered are listed, but only abnormal results are displayed)  Labs Reviewed - No data to display ____________________________________________  EKG  Not indicated ____________________________________________  RADIOLOGY  ED MD interpretation:    Official radiology report(s): No results found.  ____________________________________________   PROCEDURES  Procedure(s) performed (including Critical Care):  Procedures  ____________________________________________   INITIAL IMPRESSION / ASSESSMENT AND PLAN     44 year old male with a history of hypertension presenting to the emergency department after having a nosebleed which is since stopped.  He is noted to be hypertensive tonight.  He is asymptomatic with the exception of the nosebleed that may have been secondary to his blood pressure being elevated.  He has denied chest pain, shortness of breath, or blurred vision.  Plan will be to treat his elevated blood pressure with 2.5 mg of amlodipine and provide him with a prescription. He is to return to the ER if nose rebleeds for more than 20-30 minutes despite clamping. He is to call and schedule an appointment with primary care. He was advised to cut down on salt, stop smoking cigarettes, and take his blood pressure medication as prescribed.  ____________________________________________   FINAL CLINICAL IMPRESSION(S) / ED DIAGNOSES  Final diagnoses:  Epistaxis  Hypertension, unspecified type     ED Discharge Orders         Ordered    amLODipine (NORVASC) 2.5 MG tablet   Daily     12/30/19 2101           Eduardo King was evaluated in Emergency Department on 12/30/2019 for the symptoms described in the history of present illness. He was evaluated in the context of the global COVID-19 pandemic, which necessitated consideration that the patient might be at risk for infection with the SARS-CoV-2 virus that causes COVID-19. Institutional protocols and algorithms that pertain to the evaluation of patients at risk for COVID-19 are in a state of rapid change based on information released by regulatory bodies including the CDC and federal and state organizations. These policies and algorithms were followed during the patient's care in the ED.   Note:  This document was prepared using Dragon voice recognition software and may include unintentional dictation errors.   Chinita Pester, FNP 12/30/19 2109    Minna Antis, MD 12/31/19 0003

## 2019-12-30 NOTE — ED Notes (Signed)
Provider at bedside

## 2020-05-11 ENCOUNTER — Encounter: Payer: Self-pay | Admitting: Emergency Medicine

## 2020-05-11 ENCOUNTER — Other Ambulatory Visit: Payer: Self-pay

## 2020-05-11 ENCOUNTER — Emergency Department
Admission: EM | Admit: 2020-05-11 | Discharge: 2020-05-11 | Disposition: A | Discharge status: Home / Self Care | Payer: Commercial Managed Care - PPO | Attending: Emergency Medicine | Admitting: Emergency Medicine

## 2020-05-11 ENCOUNTER — Emergency Department: Payer: Commercial Managed Care - PPO

## 2020-05-11 DIAGNOSIS — Y998 Other external cause status: Secondary | ICD-10-CM | POA: Insufficient documentation

## 2020-05-11 DIAGNOSIS — S39012A Strain of muscle, fascia and tendon of lower back, initial encounter: Secondary | ICD-10-CM | POA: Diagnosis present

## 2020-05-11 DIAGNOSIS — Y939 Activity, unspecified: Secondary | ICD-10-CM | POA: Insufficient documentation

## 2020-05-11 DIAGNOSIS — F17200 Nicotine dependence, unspecified, uncomplicated: Secondary | ICD-10-CM | POA: Insufficient documentation

## 2020-05-11 DIAGNOSIS — I10 Essential (primary) hypertension: Secondary | ICD-10-CM | POA: Insufficient documentation

## 2020-05-11 DIAGNOSIS — Z79899 Other long term (current) drug therapy: Secondary | ICD-10-CM | POA: Insufficient documentation

## 2020-05-11 DIAGNOSIS — Y929 Unspecified place or not applicable: Secondary | ICD-10-CM | POA: Insufficient documentation

## 2020-05-11 MED ORDER — AMLODIPINE BESYLATE 5 MG PO TABS: 5.0000 mg | ORAL_TABLET | Freq: Every day | ORAL | 11 refills | Status: DC

## 2020-05-11 MED ORDER — HYDROCODONE-ACETAMINOPHEN 5-325 MG PO TABS: 1.0000 | ORAL_TABLET | Freq: Four times a day (QID) | ORAL | 0 refills | Status: DC | PRN

## 2020-05-11 NOTE — Discharge Instructions (Addendum)
The blood pressure medicine you were on was amlodipine 2-1/2 mg 1 a day.  There was supposed to been enough for a year.  Her blood pressure still high though I am going to give you 5 mg once a day.  Do not take both prescriptions together.  Please be sure to follow-up with primary care doctor.  You can try [facility] or [facility] or [facility] care [facility] if you have insurance and if you do not you can try the [facility] clinic or the [facility] or the [facility] or the [facility] or [facility] charity care.  For the back pain take Vicodin 1 pill 4 times a day for the couple days then just regular Tylenol.  Use heat to the back but be careful not to fall asleep on a heating pad as you can get burns.  Return here if you are worse.  Follow-up with your regular doctor in the next week or so.
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
## 2020-12-25 ENCOUNTER — Other Ambulatory Visit: Payer: Self-pay

## 2020-12-25 ENCOUNTER — Encounter: Payer: Self-pay | Admitting: Emergency Medicine

## 2020-12-25 ENCOUNTER — Emergency Department
Admission: EM | Admit: 2020-12-25 | Discharge: 2020-12-25 | Disposition: A | Discharge status: Home / Self Care | Payer: Commercial Managed Care - PPO | Attending: Emergency Medicine | Admitting: Emergency Medicine

## 2020-12-25 ENCOUNTER — Emergency Department: Payer: Commercial Managed Care - PPO

## 2020-12-25 DIAGNOSIS — Z79899 Other long term (current) drug therapy: Secondary | ICD-10-CM | POA: Diagnosis not present

## 2020-12-25 DIAGNOSIS — I10 Essential (primary) hypertension: Secondary | ICD-10-CM | POA: Insufficient documentation

## 2020-12-25 DIAGNOSIS — M25375 Other instability, left foot: Secondary | ICD-10-CM

## 2020-12-25 DIAGNOSIS — M722 Plantar fascial fibromatosis: Secondary | ICD-10-CM | POA: Diagnosis not present

## 2020-12-25 DIAGNOSIS — F17200 Nicotine dependence, unspecified, uncomplicated: Secondary | ICD-10-CM | POA: Diagnosis not present

## 2020-12-25 DIAGNOSIS — M79672 Pain in left foot: Secondary | ICD-10-CM | POA: Diagnosis present

## 2020-12-25 MED ORDER — MELOXICAM 15 MG PO TABS
15.0000 mg | ORAL_TABLET | Freq: Every day | ORAL | 0 refills | Status: AC
Start: 2020-12-25 — End: 2021-01-09

## 2020-12-25 MED ORDER — MELOXICAM 7.5 MG PO TABS
15.0000 mg | ORAL_TABLET | Freq: Once | ORAL | Status: AC
  Administered 2020-12-25: 15 mg via ORAL
  Filled 2020-12-25: qty 2

## 2020-12-25 NOTE — ED Provider Notes (Signed)
[facility] Emergency Department Provider Note  ____________________________________________   Event Date/Time   First MD Initiated Contact with Patient 12/25/20 1525     (approximate)  I have reviewed the triage vital signs and the nursing notes.   HISTORY  Chief Complaint Foot Pain   HPI Eduardo FJELSTAD is a 45 y.o. male who presents to the ER for evaluation of left foot pain. Patient states he has a sharp pain in the middle of the dorsum of his foot as well as the base of the plantar surface of his heel that has been present for several months. States he had surgery years ago after falling from a 2-story height and reports that he was supposed return for a follow-up surgery, however he never did.  He reports not having any follow-up care regarding this.  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  He denies any swelling that he is noticed, no fevers or other systemic symptoms.  No alleviating measures of been attempted.  Pain is described as sharp and 10/10, worse with movement, particularly stairs.         Past Medical History:  Diagnosis Date  . Hypertension     There are no problems to display for this patient.   Past Surgical History:  Procedure Laterality Date  . FOOT SURGERY      Prior to Admission medications   Medication Sig Start Date End Date Taking? Authorizing Provider  meloxicam (MOBIC) 15 MG tablet Take 1 tablet (15 mg total) by mouth daily for 15 days. 12/25/20 01/09/21 Yes Masai Kidd, Ruben Gottron, PA  amLODipine (NORVASC) 2.5 MG tablet Take 1 tablet (2.5 mg total) by mouth daily. 12/30/19 12/29/20  Triplett, Rulon Eisenmenger B, FNP  amLODipine (NORVASC) 5 MG tablet Take 1 tablet (5 mg total) by mouth daily. 05/11/20 05/11/21  Arnaldo Natal, MD  dicyclomine (BENTYL) 10 MG capsule Take 1 capsule (10 mg total) by mouth 3 (three) times daily before meals. 03/09/15 03/23/15  Minna Antis,  MD  HYDROcodone-acetaminophen (NORCO/VICODIN) 5-325 MG per tablet Take 1 tablet by mouth every 4 (four) hours as needed for moderate pain. 03/09/15   Minna Antis, MD  HYDROcodone-acetaminophen (NORCO/VICODIN) 5-325 MG tablet Take 1 tablet by mouth every 6 (six) hours as needed for moderate pain. 05/11/20   Arnaldo Natal, MD  potassium chloride (K-DUR) 10 MEQ tablet Take 1 tablet (10 mEq total) by mouth 2 (two) times daily. 03/09/15   Minna Antis, MD  trimethoprim-polymyxin b (POLYTRIM) ophthalmic solution Place 2 drops into both eyes every 6 (six) hours. 04/13/16   Cuthriell, Delorise Royals, PA-C    Allergies Shellfish allergy and Iodine  No family history on file.  Social History Social History   Tobacco Use  . Smoking status: Current Every Day Smoker  . Smokeless tobacco: Never Used  Substance Use Topics  . Alcohol use: Yes    Comment: "beer on the weekends"  . Drug use: No    Review of Systems Constitutional: No fever/chills Eyes: No visual changes. ENT: No sore throat. Cardiovascular: Denies chest pain. Respiratory: Denies shortness of breath. Gastrointestinal: No abdominal pain.  No nausea, no vomiting.  No diarrhea.  No constipation. Genitourinary: Negative for dysuria. Musculoskeletal: + Left foot pain, negative for back pain. Skin: Negative for rash. Neurological: Negative for headaches, focal weakness or numbness.   ____________________________________________   PHYSICAL EXAM:  VITAL SIGNS: ED Triage Vitals  Enc Vitals Group     BP 12/25/20 1508 (!) 153/100     Pulse Rate 12/25/20 1508 80     Resp 12/25/20 1508 18     Temp 12/25/20 1508 98.6 F (37 C)     Temp Source 12/25/20 1508 Oral     SpO2 12/25/20 1508 99 %     Weight 12/25/20 1503 155 lb (70.3 kg)     Height 12/25/20 1503 6\' 2"  (1.88 m)     Head Circumference --      Peak Flow --      Pain Score 12/25/20 1503 10     Pain Loc --      Pain Edu? --      Excl. in GC? --     Constitutional: Alert and oriented. Well appearing and in no acute distress. Eyes: Conjunctivae are normal. PERRL. EOMI. Head: Atraumatic. Musculoskeletal: There is small noted rigid deformity to the medial side of the dorsum of the left foot with associated overlying old healed incision.  No swelling appreciated.  This is the point of point tenderness for the patient.  There is also point tenderness on the plantar surface of the foot at the base of the calcaneus and throughout the plantar fascia.  Ankle range of motion is full without difficulty.  Dorsal pedal pulse 2+, capillary refill less than 3 seconds all digits. Neurologic:  Normal speech and language. No gross focal neurologic deficits are appreciated.  Skin:  Skin is warm, dry and intact. No rash noted. Psychiatric: Mood and affect are normal. Speech and behavior are normal.  ____________________________________________  RADIOLOGY I, 02/24/21, personally viewed and evaluated these images (plain radiographs) as part of my medical decision making, as well as reviewing the written report by the radiologist.  ED provider interpretation: No acute fractures identified, there is site of previous screw placement with at least 2 of the screws broken.  Official radiology report(s): DG Foot Complete Left  Result Date: 12/25/2020 CLINICAL DATA:  Left foot pain EXAM: LEFT FOOT - COMPLETE 3+ VIEW COMPARISON:  None. FINDINGS: No acute fracture or dislocation of the left foot. 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 Joint spaces are otherwise preserved. Soft tissues are unremarkable. IMPRESSION: 1.  No acute fracture or dislocation of the left foot. 2. 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 Electronically Signed   By: 02/24/2021 M.D.   On: 12/25/2020 16:19    ____________________________________________   INITIAL IMPRESSION / ASSESSMENT AND PLAN / ED COURSE  As part of my medical decision making, I reviewed the following data within the electronic MEDICAL RECORD NUMBER Nursing notes reviewed and incorporated, Radiograph reviewed and Notes from prior ED visits        Patient is a 45 year old male who presents to the emergency department for evaluation of left foot pain that has been intermittent and progressing over the last several months, worse with activity and stairs.  See HPI for further details.  In triage, the patient has some hypertension but otherwise has normal vital signs.  On physical exam, there is point tenderness over the site of previous surgical incision with small deformity noted over the medial aspect of the dorsum of  the left foot.  There is also pain along the plantar fascia, particularly at its calcaneal insertion.  X-rays were reviewed and demonstrate the site of previous fracture, possibly Lisfranc injury with 2 of the 3 screws broken, no longer providing stability to the joint.  Given that this is the site of the patient's pain, it is likely the broken screws are symptomatic.  Will refer the patient to podiatry for further evaluation.  In the interim, we will start the patient on daily anti-inflammatory for symptomatic relief, and encouraged Tylenol use along with this.  Patient is amenable with this plan, stable this time for outpatient follow-up.      ____________________________________________   FINAL CLINICAL IMPRESSION(S) / ED DIAGNOSES  Final diagnoses:  Instability of left foot joint  Plantar fasciitis     ED Discharge Orders         Ordered    meloxicam (MOBIC) 15 MG tablet  Daily        12/25/20 1640           *Please note:  DINO BORNTREGER was evaluated in Emergency Department on 12/25/2020 for the symptoms described in the history of present illness. He was evaluated in the context of the global COVID-19 pandemic, which necessitated consideration that the patient might be at risk for infection with the SARS-CoV-2 virus that causes COVID-19. Institutional protocols and algorithms that pertain to the evaluation of patients at risk for COVID-19 are in a state of rapid change based on information released by regulatory bodies including the CDC and federal and state organizations. These policies and algorithms were followed during the patient's care in the ED.  Some ED evaluations and interventions may be delayed as a result of limited staffing during and the pandemic.*   Note:  This document was prepared using Dragon voice recognition software and may include unintentional dictation errors.   Lucy Chris, PA 12/25/20 1730    Shaune Pollack, MD 12/25/20 2142

## 2020-12-25 NOTE — ED Triage Notes (Signed)
Pt reports intermittent sharp pain to his left heel for the past couple of months. Pt reports does a lot of walking at work.

## 2020-12-25 NOTE — Discharge Instructions (Addendum)
Please follow up with podiatry regarding your foot pain. Take anti-inflammatory as prescribed. Return to ER with any acute worsening.

## 2021-07-09 ENCOUNTER — Emergency Department: Payer: Commercial Managed Care - PPO

## 2021-07-09 ENCOUNTER — Encounter: Payer: Self-pay | Admitting: Physician Assistant

## 2021-07-09 ENCOUNTER — Emergency Department
Admission: EM | Admit: 2021-07-09 | Discharge: 2021-07-09 | Disposition: A | Discharge status: Home / Self Care | Payer: Commercial Managed Care - PPO | Attending: Emergency Medicine | Admitting: Emergency Medicine

## 2021-07-09 DIAGNOSIS — M79673 Pain in unspecified foot: Secondary | ICD-10-CM

## 2021-07-09 DIAGNOSIS — M79672 Pain in left foot: Secondary | ICD-10-CM | POA: Insufficient documentation

## 2021-07-09 DIAGNOSIS — Z5321 Procedure and treatment not carried out due to patient leaving prior to being seen by health care provider: Secondary | ICD-10-CM | POA: Diagnosis not present

## 2021-07-09 NOTE — ED Notes (Signed)
Pt called with no answer

## 2021-07-09 NOTE — ED Triage Notes (Signed)
Pt c/o intermittent L foot pain.  Pain score 10/10.  Pt reports having surgery on foot "a long time ago."  Sts 3 screws were placed and 2 have broken.  He feels the 3rd may have broken.  He is requesting an xray so he can get revision surgery.

## 2021-08-31 ENCOUNTER — Emergency Department: Payer: No Typology Code available for payment source

## 2021-08-31 ENCOUNTER — Other Ambulatory Visit: Payer: Self-pay

## 2021-08-31 ENCOUNTER — Encounter: Payer: Self-pay | Admitting: Emergency Medicine

## 2021-08-31 DIAGNOSIS — R519 Headache, unspecified: Secondary | ICD-10-CM | POA: Insufficient documentation

## 2021-08-31 DIAGNOSIS — R42 Dizziness and giddiness: Secondary | ICD-10-CM | POA: Insufficient documentation

## 2021-08-31 DIAGNOSIS — Z5321 Procedure and treatment not carried out due to patient leaving prior to being seen by health care provider: Secondary | ICD-10-CM | POA: Insufficient documentation

## 2021-08-31 NOTE — ED Notes (Signed)
No protocols at this time per [place], [place].

## 2021-08-31 NOTE — ED Triage Notes (Signed)
Pt to ED from home c/o headache and dizziness since this afternoon.  States has not been taking blood pressure medication for months d/t changing prescription and has not been able to get new one.  States pain to bilat temple, denies n/v/d, in NAD at this time.

## 2021-09-01 ENCOUNTER — Emergency Department
Admission: EM | Admit: 2021-09-01 | Discharge: 2021-09-01 | Disposition: A | Discharge status: Home / Self Care | Payer: No Typology Code available for payment source | Attending: Emergency Medicine | Admitting: Emergency Medicine

## 2021-09-01 NOTE — ED Notes (Signed)
No answer when called several times from lobby 

## 2022-01-20 ENCOUNTER — Emergency Department
Admission: EM | Admit: 2022-01-20 | Discharge: 2022-01-20 | Disposition: A | Discharge status: Home / Self Care | Payer: Self-pay | Attending: Emergency Medicine | Admitting: Emergency Medicine

## 2022-01-20 ENCOUNTER — Other Ambulatory Visit: Payer: Self-pay

## 2022-01-20 DIAGNOSIS — I10 Essential (primary) hypertension: Secondary | ICD-10-CM | POA: Insufficient documentation

## 2022-01-20 DIAGNOSIS — H5789 Other specified disorders of eye and adnexa: Secondary | ICD-10-CM | POA: Insufficient documentation

## 2022-01-20 MED ORDER — FLUORESCEIN SODIUM 1 MG OP STRP
1.0000 | ORAL_STRIP | Freq: Once | OPHTHALMIC | Status: AC
  Administered 2022-01-20: 1 via OPHTHALMIC
  Filled 2022-01-20: qty 1

## 2022-01-20 MED ORDER — TETRACAINE HCL 0.5 % OP SOLN
2.0000 [drp] | Freq: Once | OPHTHALMIC | Status: AC
  Administered 2022-01-20: 2 [drp] via OPHTHALMIC
  Filled 2022-01-20: qty 4

## 2022-01-20 MED ORDER — CARBOXYMETHYLCELLULOSE SODIUM 0.5 % OP SOLN: 1.0000 [drp] | Freq: Three times a day (TID) | OPHTHALMIC | 0 refills | Status: DC | PRN

## 2022-01-20 MED ORDER — HYPROMELLOSE (GONIOSCOPIC) 2.5 % OP SOLN: 1.0000 [drp] | Freq: Two times a day (BID) | OPHTHALMIC | 0 refills | Status: AC

## 2022-01-20 NOTE — Discharge Instructions (Addendum)
I did not see any foreign body in your eyes today.  You may have some irritation from dry eyes.  You can use the artificial tears as needed. ?

## 2022-01-20 NOTE — ED Triage Notes (Signed)
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  ?

## 2022-01-20 NOTE — ED Provider Notes (Signed)
? ?[facility] ?Provider Note ? ? ? Event Date/Time  ? First MD Initiated Contact with Patient 01/20/22 1033   ?  (approximate) ? ? ?History  ? ?Eye Pain ? ? ?HPI ? ?Eduardo King is a 46 y.o. male with past medical history of hypertension presents with concern for foreign body in his eyes.  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  Notes that occasionally he will have very fine dusting of metal on his hands.  When he woke up this morning he rubbed his eyes in bed and then looked down on his hands and saw some metal specks and was concerned there could be foreign body in his eyes.  Has some generalized eye irritation bilaterally denies visual change or redness or drainage.  Does not wear contacts or glasses.  Told by his supervisor to get his eyes examined. ?  ? ?Past Medical History:  ?Diagnosis Date  ? Hypertension   ? ? ?There are no problems to display for this patient. ? ? ? ?Physical Exam  ?Triage Vital Signs: ?ED Triage Vitals  ?Enc Vitals Group  ?   BP 01/20/22 1003 (!) 158/98  ?   Pulse Rate 01/20/22 1003 92  ?   Resp 01/20/22 1003 16  ?   Temp 01/20/22 1003 98.8 ?F (37.1 ?C)  ?   Temp Source 01/20/22 1003 Oral  ?   SpO2 01/20/22 1003 100 %  ?   Weight 01/20/22 1028 154 lb 1.6 oz (69.9 kg)  ?   Height 01/20/22 1028 6\' 2"  (1.88 m)  ?   Head Circumference --   ?   Peak Flow --   ?   Pain Score --   ?   Pain Loc --   ?   Pain Edu? --   ?   Excl. in GC? --   ? ? ?Most recent vital signs: ?Vitals:  ? 01/20/22 1003  ?BP: (!) 158/98  ?Pulse: 92  ?Resp: 16  ?Temp: 98.8 ?F (37.1 ?C)  ?SpO2: 100%  ? ? ? ?General: Awake, no distress.  ?CV:  Good peripheral perfusion.  ?Resp:  Normal effort.  ?Abd:  No distention.  ?Neuro:             Awake, Alert, Oriented x 3  ?Other:  PERRLA, EOMI, no conjunctival injection bilaterally ? Foreign body noted, no foreign body on lid eversion bilaterally ?No abrasion on fluorescein exam ? ? ?ED Results / Procedures / Treatments   ?Labs ?(all labs ordered are listed, but only abnormal results are displayed) ?Labs Reviewed - No data to display ? ? ?EKG ? ? ? ? ?RADIOLOGY ? ? ? ?PROCEDURES: ? ?Critical Care performed: No ? ?Procedures ? ? ?MEDICATIONS ORDERED IN ED: ?Medications  ?tetracaine (PONTOCAINE) 0.5 % ophthalmic solution 2 drop (has no administration in time range)  ?fluorescein ophthalmic strip 1 strip (has no administration in time range)  ? ? ? ?IMPRESSION / MDM / ASSESSMENT AND PLAN / ED COURSE  ?I reviewed the triage vital signs and the nursing notes. ?             ?               ? ?Differential diagnosis includes, but is not limited to, dry eye, eye irritation, less likely foreign body ? ?03/22/22 is a 46 year old male presents with concern for small pieces of metal potentially in bilateral eyes.  Patient works with metal at work  however denies feeling like anything got into the eye while he was working.  Today woke up and rubbed his eyes bilaterally and felt that there could have been some metal specks from his hands given the size.  Has some generalized eye irritation but no significant pain redness or drainage visual change.  Eye exam is rather unremarkable he has no conjunctival injection pupils are equal round and reactive and he has no obvious foreign body noted, fully everted.  No uptake on fluorescein exam.  I suspect that his eyes might just be irritated from being dry.  Will prescribe artificial tears. ? ?  ? ? ?FINAL CLINICAL IMPRESSION(S) / ED DIAGNOSES  ? ?Final diagnoses:  ?Eye irritation  ? ? ? ?Rx / DC Orders  ? ?ED Discharge Orders   ? ?      Ordered  ?  hydroxypropyl methylcellulose / hypromellose (ISOPTO TEARS / GONIOVISC) 2.5 % ophthalmic solution  2 times daily       ? 01/20/22 1118  ? ?  ?  ? ?  ? ? ? ?Note:  This document was prepared using Dragon voice recognition software and may include unintentional dictation errors. ?  ?Georga Hacking, MD ?01/20/22 1120 ? ?

## 2022-01-20 NOTE — ED Notes (Signed)
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

## 2022-02-20 ENCOUNTER — Other Ambulatory Visit: Payer: Self-pay

## 2022-02-20 ENCOUNTER — Emergency Department
Admission: EM | Admit: 2022-02-20 | Discharge: 2022-02-20 | Disposition: A | Discharge status: Home / Self Care | Payer: Commercial Managed Care - HMO | Attending: Emergency Medicine | Admitting: Emergency Medicine

## 2022-02-20 DIAGNOSIS — R252 Cramp and spasm: Secondary | ICD-10-CM | POA: Insufficient documentation

## 2022-02-20 DIAGNOSIS — M79605 Pain in left leg: Secondary | ICD-10-CM | POA: Insufficient documentation

## 2022-02-20 LAB — CBC WITH DIFFERENTIAL/PLATELET
Abs Immature Granulocytes: 0.05 10*3/uL (ref 0.00–0.07)
Basophils Absolute: 0.1 10*3/uL (ref 0.0–0.1)
Basophils Relative: 0 %
Eosinophils Absolute: 0.1 10*3/uL (ref 0.0–0.5)
Eosinophils Relative: 1 %
HCT: 43 % (ref 39.0–52.0)
Hemoglobin: 14.4 g/dL (ref 13.0–17.0)
Immature Granulocytes: 0 %
Lymphocytes Relative: 14 %
Lymphs Abs: 1.7 10*3/uL (ref 0.7–4.0)
MCH: 31.2 pg (ref 26.0–34.0)
MCHC: 33.5 g/dL (ref 30.0–36.0)
MCV: 93.3 fL (ref 80.0–100.0)
Monocytes Absolute: 1 10*3/uL (ref 0.1–1.0)
Monocytes Relative: 8 %
Neutro Abs: 9.4 10*3/uL — ABNORMAL HIGH (ref 1.7–7.7)
Neutrophils Relative %: 77 %
Platelets: 256 10*3/uL (ref 150–400)
RBC: 4.61 MIL/uL (ref 4.22–5.81)
RDW: 13.8 % (ref 11.5–15.5)
WBC: 12.3 10*3/uL — ABNORMAL HIGH (ref 4.0–10.5)
nRBC: 0 % (ref 0.0–0.2)

## 2022-02-20 LAB — COMPREHENSIVE METABOLIC PANEL
ALT: 22 U/L (ref 0–44)
AST: 27 U/L (ref 15–41)
Albumin: 4.5 g/dL (ref 3.5–5.0)
Alkaline Phosphatase: 45 U/L (ref 38–126)
Anion gap: 7 (ref 5–15)
BUN: 10 mg/dL (ref 6–20)
CO2: 29 mmol/L (ref 22–32)
Calcium: 9.6 mg/dL (ref 8.9–10.3)
Chloride: 104 mmol/L (ref 98–111)
Creatinine, Ser: 0.74 mg/dL (ref 0.61–1.24)
GFR, Estimated: >60 mL/min (ref >60)
Glucose, Bld: 81 mg/dL (ref 70–99)
Potassium: 4.2 mmol/L (ref 3.5–5.1)
Sodium: 140 mmol/L (ref 135–145)
Total Bilirubin: 0.8 mg/dL (ref 0.3–1.2)
Total Protein: 7.9 g/dL (ref 6.5–8.1)

## 2022-02-20 MED ORDER — AMLODIPINE BESYLATE 5 MG PO TABS: 5.0000 mg | ORAL_TABLET | Freq: Every day | ORAL | 3 refills | Status: DC

## 2022-02-20 MED ORDER — CYCLOBENZAPRINE HCL 5 MG PO TABS: 5.0000 mg | ORAL_TABLET | Freq: Three times a day (TID) | ORAL | 0 refills | Status: AC | PRN

## 2022-02-20 NOTE — ED Provider Notes (Signed)
? ? ?[facility] ?Emergency Department Provider Note ? ? ? ? Event Date/Time  ? First MD Initiated Contact with Patient 02/20/22 1154   ?  (approximate) ? ? ?History  ? ?Leg Pain ? ? ?HPI ? ?Eduardo King is a 46 y.o. male history of hypertension, presents to the ED with reports of left leg cramping for the last 2 days.  Patient reports cramps that he reports feels "like a charley horse".  Patient denies any foot swelling, skin temperature, changes, chest pain, or shortness of breath.  Denies any recent injury, trauma, or falls. ?  ? ? ?Physical Exam  ? ?Triage Vital Signs: ?ED Triage Vitals [02/20/22 1118]  ?Enc Vitals Group  ?   BP (!) 124/93  ?   Pulse Rate 75  ?   Resp 16  ?   Temp 98.3 ?F (36.8 ?C)  ?   Temp Source Oral  ?   SpO2 98 %  ?   Weight   ?   Height   ?   Head Circumference   ?   Peak Flow   ?   Pain Score   ?   Pain Loc   ?   Pain Edu?   ?   Excl. in GC?   ? ? ?Most recent vital signs: ?Vitals:  ? 02/20/22 1118  ?BP: (!) 124/93  ?Pulse: 75  ?Resp: 16  ?Temp: 98.3 ?F (36.8 ?C)  ?SpO2: 98%  ? ? ?General Awake, no distress.  ?CV:  Good peripheral perfusion.  ?RESP:  Normal effort.  ?ABD:  No distention.  ?MSK:  Left lower extremity without obvious deformity, dislocation, or joint effusions.  No calf or Achilles tenderness on exam.  No palpable cords or edema appreciated.  Normal ankle range of motion. ?SKIN:  Warm, dry, intact. ? ? ?ED Results / Procedures / Treatments  ? ?Labs ?(all labs ordered are listed, but only abnormal results are displayed) ?Labs Reviewed  ?CBC WITH DIFFERENTIAL/PLATELET - Abnormal; Notable for the following components:  ?    Result Value  ? WBC 12.3 (*)   ? Neutro Abs 9.4 (*)   ? All other components within normal limits  ?COMPREHENSIVE METABOLIC PANEL  ? ? ? ?EKG ? ? ?RADIOLOGY ? ? ?No results found. ? ? ?PROCEDURES: ? ?Critical Care performed: No ? ?Procedures ? ? ?MEDICATIONS ORDERED IN ED: ?Medications - No data to display ? ? ?IMPRESSION / MDM /  ASSESSMENT AND PLAN / ED COURSE  ?I reviewed the triage vital signs and the nursing notes. ?             ?               ? ?Differential diagnosis includes, but is not limited to, leg cramp, muscle strain, DVT, Achilles tendinitis ? ?Patient to the ED for evaluation of left leg pain and cramping over the last 2 days but patient presents in no acute distress without fever or trauma reported.  No cough, congestion, swelling, chest pain reported.  Patient exam is reassuring without signs of acute soft tissue injury.  Skin is warm, dry without edema without concern for DVT.  We discussed the appropriate neck step which would be a DVT study and patient declined at this time, citing that his ride was waiting for him.  No laboratory evidence of any acute electrolyte abnormality but CBC evidence of a nonspecific mild leukocytosis.  Patient's diagnosis is consistent with leg cramp/leg pain. Patient will  be discharged home with prescriptions for benzopyrene and a courtesy refill of his blood pressure medicine. Patient is to follow up with local community clinic as needed or otherwise directed. Patient is given ED precautions to return to the ED for any worsening or new symptoms. ? ?FINAL CLINICAL IMPRESSION(S) / ED DIAGNOSES  ? ?Final diagnoses:  ?Left leg pain  ?Muscle cramp  ? ? ? ?Rx / DC Orders  ? ?ED Discharge Orders   ? ?      Ordered  ?  amLODipine (NORVASC) 5 MG tablet  Daily       ? 02/20/22 1318  ?  cyclobenzaprine (FLEXERIL) 5 MG tablet  3 times daily PRN       ? 02/20/22 1318  ? ?  ?  ? ?  ? ? ? ?Note:  This document was prepared using Dragon voice recognition software and may include unintentional dictation errors. ? ? ?  ?Lissa Hoard, PA-C ?02/23/22 1903 ? ?  ?Delton Prairie, MD ?02/24/22 [phone] ? ?

## 2022-02-20 NOTE — ED Triage Notes (Signed)
Pt c/o left leg cramping intermittently over the past 2 days, denies swelling or tender to touch. Denies injury, states "its like a charlie horse but last longer" ?

## 2022-02-20 NOTE — ED Notes (Signed)
Patient discharged to home per MD order. Patient in stable condition, and deemed medically cleared by ED provider for discharge. Discharge instructions reviewed with patient/family using "Teach Back"; verbalized understanding of medication education and administration, and information about follow-up care. Denies further concerns. ° °

## 2022-02-20 NOTE — Discharge Instructions (Addendum)
Your exam and labs are essentially normal. Follow-up with [facility] clinic or return if needed. Take the prescription meds as directed.  ?

## 2022-08-08 ENCOUNTER — Other Ambulatory Visit: Payer: Self-pay

## 2022-08-08 ENCOUNTER — Emergency Department
Admission: EM | Admit: 2022-08-08 | Discharge: 2022-08-08 | Disposition: A | Discharge status: Home / Self Care | Payer: Commercial Managed Care - HMO | Attending: Emergency Medicine | Admitting: Emergency Medicine

## 2022-08-08 DIAGNOSIS — Z20822 Contact with and (suspected) exposure to covid-19: Secondary | ICD-10-CM | POA: Insufficient documentation

## 2022-08-08 DIAGNOSIS — B349 Viral infection, unspecified: Secondary | ICD-10-CM | POA: Diagnosis not present

## 2022-08-08 DIAGNOSIS — I10 Essential (primary) hypertension: Secondary | ICD-10-CM | POA: Insufficient documentation

## 2022-08-08 LAB — SARS CORONAVIRUS 2 BY RT PCR: SARS Coronavirus 2 by RT PCR: NEGATIVE

## 2022-08-08 MED ORDER — LOPERAMIDE HCL 2 MG PO TABS: 2.0000 mg | ORAL_TABLET | Freq: Four times a day (QID) | ORAL | 0 refills | Status: AC | PRN

## 2022-08-08 MED ORDER — AMLODIPINE BESYLATE 5 MG PO TABS: 5.0000 mg | ORAL_TABLET | Freq: Every day | ORAL | 11 refills | Status: DC

## 2022-08-08 MED ORDER — PSEUDOEPH-BROMPHEN-DM 30-2-10 MG/5ML PO SYRP: 10.0000 mL | ORAL_SOLUTION | Freq: Four times a day (QID) | ORAL | 0 refills | Status: AC | PRN

## 2022-08-08 MED ORDER — ONDANSETRON 4 MG PO TBDP: 4.0000 mg | ORAL_TABLET | Freq: Three times a day (TID) | ORAL | 0 refills | Status: AC | PRN

## 2022-08-08 MED ORDER — ACETAMINOPHEN 325 MG PO TABS
650.0000 mg | ORAL_TABLET | Freq: Once | ORAL | Status: AC | PRN
  Administered 2022-08-08: 650 mg via ORAL
  Filled 2022-08-08: qty 2

## 2022-08-08 NOTE — ED Provider Notes (Signed)
[facility] Provider Note  Patient Contact: 8:45 PM (approximate)   History   Hypertension   HPI  Eduardo King is a 46 y.o. male who presents the emergency department primarily for refill of his blood pressure medication.  Patient states that he has been out of his Norvasc for roughly a month.  Patient also is endorsing some viral symptoms with congestion, sore throat, cough, diarrhea.  This has been ongoing for the last 2 to 3 days.  Patient did arrive with a fever.  Patient denies any headache, visual changes, chest pain, shortness of breath, urinary changes.     Physical Exam   Triage Vital Signs: ED Triage Vitals  Enc Vitals Group     BP 08/08/22 2017 (!) 138/102     Pulse Rate 08/08/22 2017 (!) 111     Resp 08/08/22 2017 20     Temp 08/08/22 2017 (!) 100.5 F (38.1 C)     Temp Source 08/08/22 2017 Oral     SpO2 08/08/22 2017 95 %     Weight 08/08/22 2019 155 lb (70.3 kg)     Height 08/08/22 2019 6\' 2"  (1.88 m)     Head Circumference --      Peak Flow --      Pain Score 08/08/22 2018 0     Pain Loc --      Pain Edu? --      Excl. in GC? --     Most recent vital signs: Vitals:   08/08/22 2017  BP: (!) 138/102  Pulse: (!) 111  Resp: 20  Temp: (!) 100.5 F (38.1 C)  SpO2: 95%     General: Alert and in no acute distress. ENT:      Ears:       Nose: No congestion/rhinnorhea.      Mouth/Throat: Mucous membranes are moist. Neck: No stridor. No cervical spine tenderness to palpation.  Cardiovascular:  Good peripheral perfusion Respiratory: Normal respiratory effort without tachypnea or retractions. Lungs CTAB. Good air entry to the bases with no decreased or absent breath sounds. Gastrointestinal: Bowel sounds 4 quadrants. Soft and nontender to palpation. No guarding or rigidity. No palpable masses. No distention. No CVA tenderness. Musculoskeletal: Full range of motion to all extremities.  Neurologic:  No gross focal neurologic  deficits are appreciated.  Skin:   No rash noted Other:   ED Results / Procedures / Treatments   Labs (all labs ordered are listed, but only abnormal results are displayed) Labs Reviewed  SARS CORONAVIRUS 2 BY RT PCR     EKG     RADIOLOGY    No results found.  PROCEDURES:  Critical Care performed: No  Procedures   MEDICATIONS ORDERED IN ED: Medications  acetaminophen (TYLENOL) tablet 650 mg (650 mg Oral Given 08/08/22 2026)     IMPRESSION / MDM / ASSESSMENT AND PLAN / ED COURSE  I reviewed the triage vital signs and the nursing notes.                              Differential diagnosis includes, but is not limited to, hypertension, viral illness, pneumonia, colitis, viral gastroenteritis  Patient's presentation is most consistent with acute presentation with potential threat to life or bodily function.   Patient's diagnosis is consistent with hypertension, viral illness.  Patient presented to the emergency department primarily for blood pressure medication refill.  Patient has been out roughly  a month and states that he cannot get a refill until he has a prescription.  Patient denied any concerning symptoms in regards to his blood pressure with headache, visual changes, chest pain or shortness of breath.  Patient did have some low-grade fever, congestion, cough and diarrhea for the past 2 to 3 days.  Given the patient's symptoms, I discussed performing imaging, as well as labs and urinalysis.  Patient states that he would like his blood pressure medication refilled but is not concerned about his viral symptoms and does not request any further work-up.  At this time patient is overall well-appearing even though he is febrile and slightly tachycardic.  At this time patient states that he is only interested in his medication refill as that is his arrival reason.  Patient states that he feels the other symptoms will pass on its own.  Symptoms do appear likely viral.   Patient states that he would like to go home and would like to be discharged.  I feel this is reasonable and have given strict return precautions for any worsening symptoms.  Patient verbalizes understanding of same..  Patient is given ED precautions to return to the ED for any worsening or new symptoms.        FINAL CLINICAL IMPRESSION(S) / ED DIAGNOSES   Final diagnoses:  Primary hypertension  Viral illness     Rx / DC Orders   ED Discharge Orders          Ordered    loperamide (IMODIUM A-D) 2 MG tablet  4 times daily PRN        08/08/22 2100    ondansetron (ZOFRAN-ODT) 4 MG disintegrating tablet  Every 8 hours PRN        08/08/22 2100    brompheniramine-pseudoephedrine-DM 30-2-10 MG/5ML syrup  4 times daily PRN        08/08/22 2100    amLODipine (NORVASC) 5 MG tablet  Daily        08/08/22 2100             Note:  This document was prepared using Dragon voice recognition software and may include unintentional dictation errors.   Darletta Moll, PA-C 08/08/22 2100    Naaman Plummer, MD 08/08/22 [phone]

## 2022-08-08 NOTE — ED Triage Notes (Addendum)
Pt comes from home via POV c/o hypertension. Pt states he felt like his blood pressure was high, feeling sick. Pt ran out of his blood pressure medication and pharmacy wont fill it so pt states he just wants the medication. Pt not having any other complaints. Pt denies CP, SOB

## 2023-11-14 IMAGING — CT CT HEAD W/O CM
4 series · 17 of 47 positions shown, 19 images · non-contrast
Comparison: None.

CLINICAL DATA: Dizziness.

EXAM:
CT HEAD WITHOUT CONTRAST
TECHNIQUE: Contiguous axial images were obtained from the base of the skull
through the vertex without intravenous contrast.

[Series 2: head wo · axial · 0.42mm/px · z∈[-119,-9]mm · 7 of 30 slices shown, 9 images]
[im 4/30  brain]
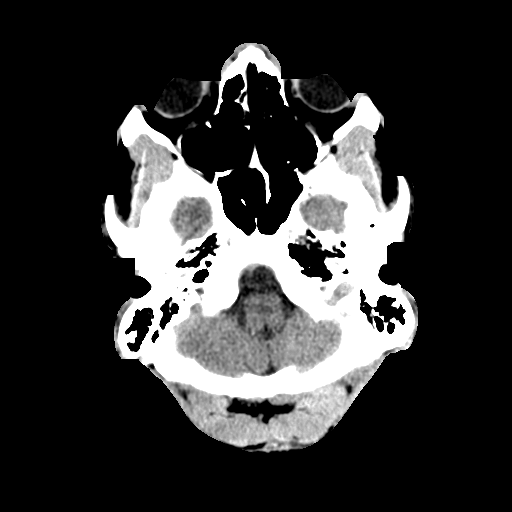
[im 4/30  bone]
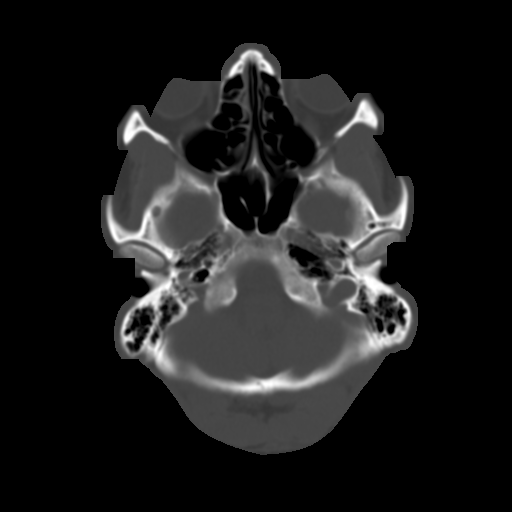
[im 8/30  brain]
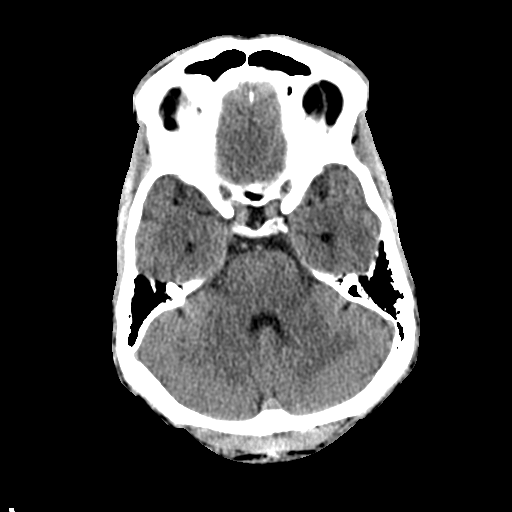
[im 11/30  brain]
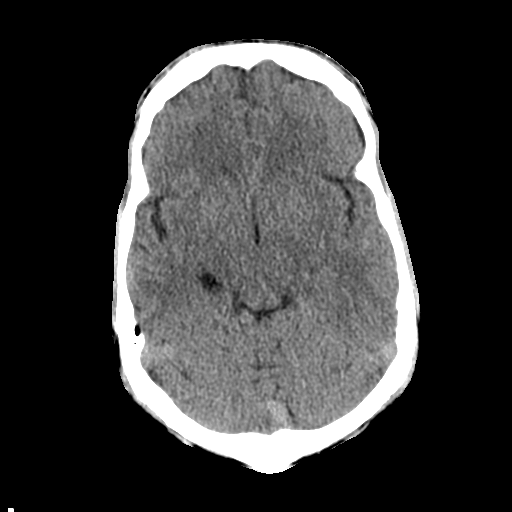
[im 15/30  brain]
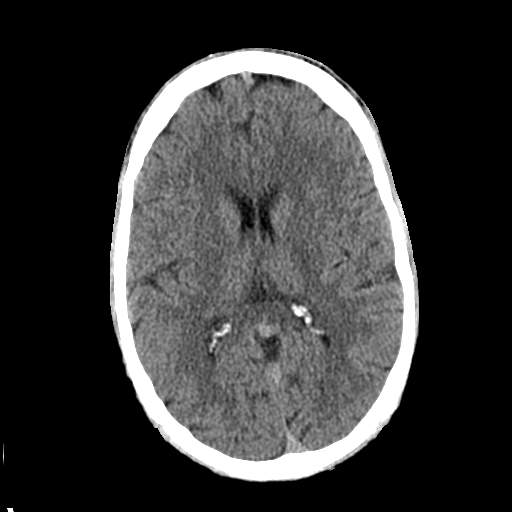
[im 19/30  brain]
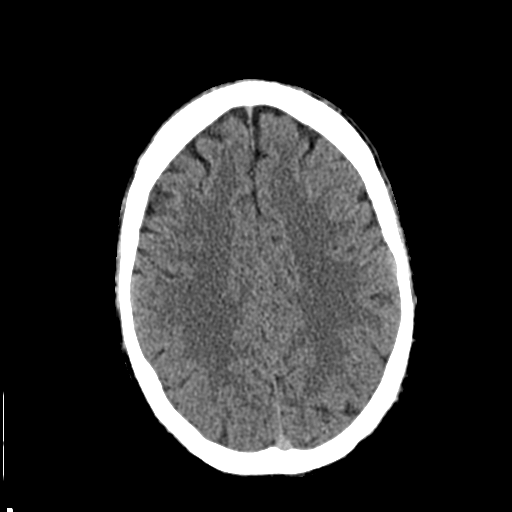
[im 19/30  bone]
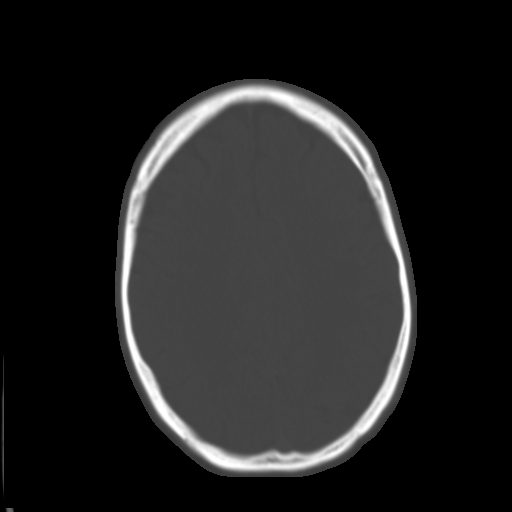
[im 22/30  brain]
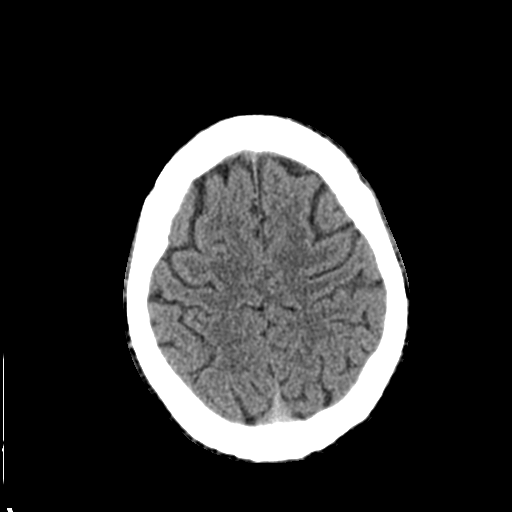
[im 26/30  brain]
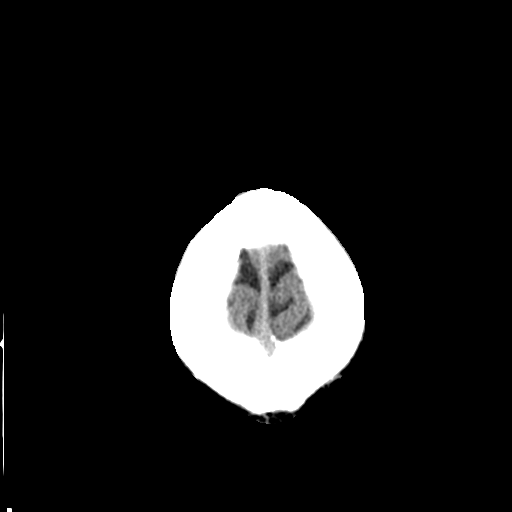

[Series 3: head bone · axial · 0.42mm/px · z∈[-120,-70]mm · 4 of 73 slices shown]
[im 8/73  bone]
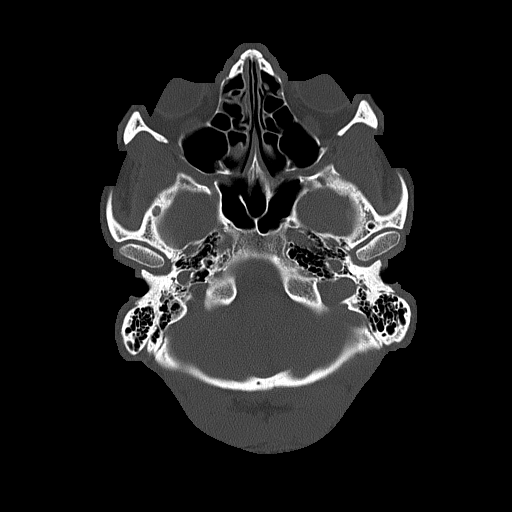
[im 15/73  bone]
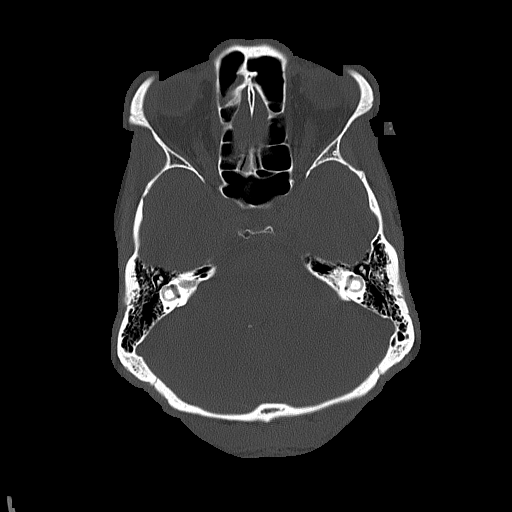
[im 22/73  bone]
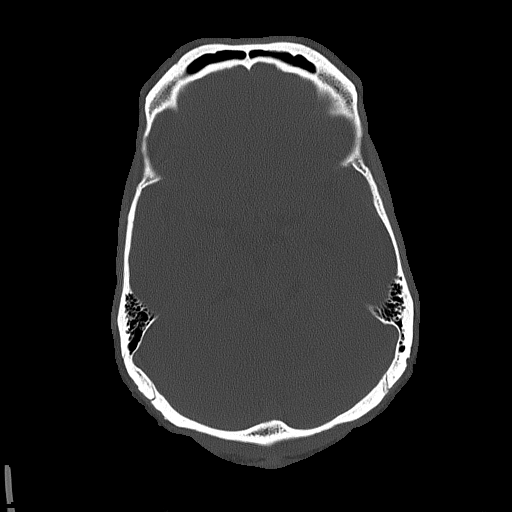
[im 33/73  bone]
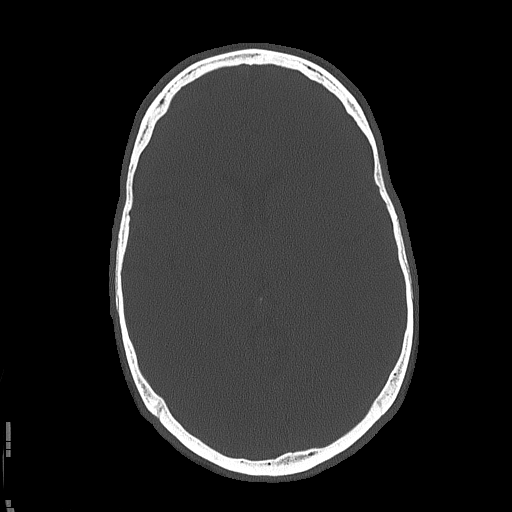

[Series 4: coronal soft tissue · coronal · 0.32mm/px · 3 of 64 slices shown]
[im 22/64  brain]
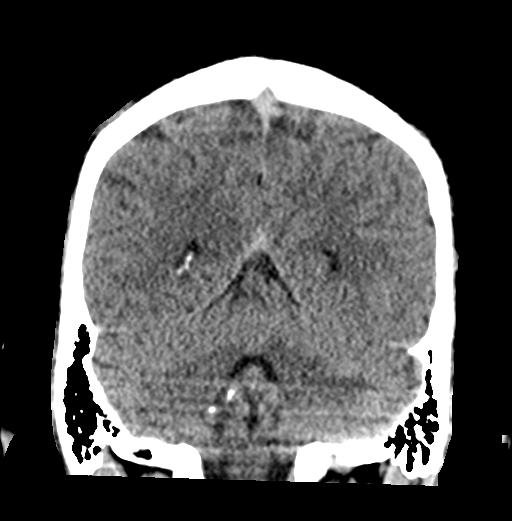
[im 29/64  brain]
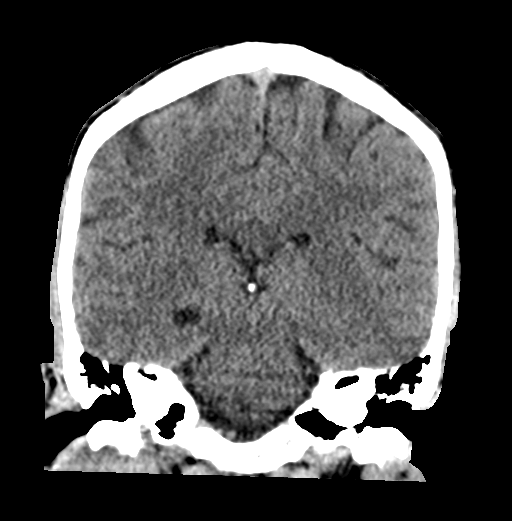
[im 36/64  brain]
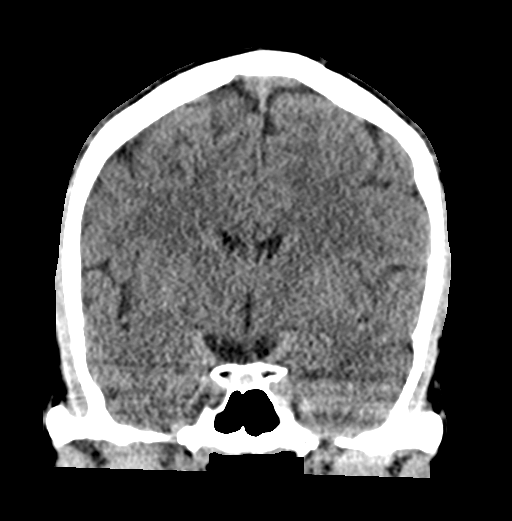

[Series 5: sagittal soft tissue · sagittal · 0.31mm/px · 3 of 51 slices shown]
[im 17/51  brain]
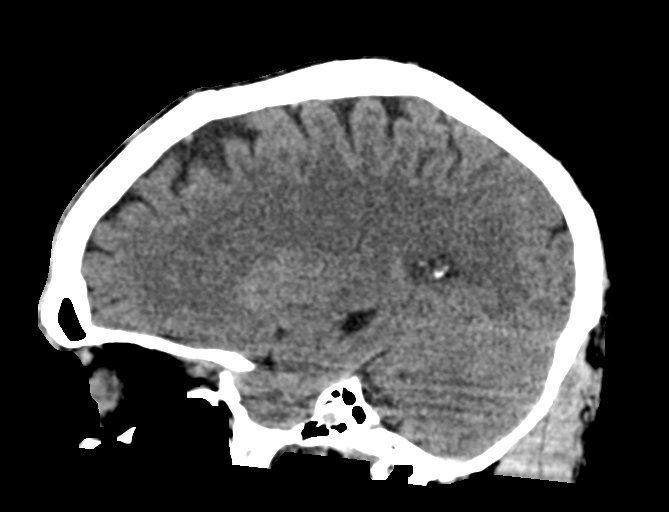
[im 26/51  brain]
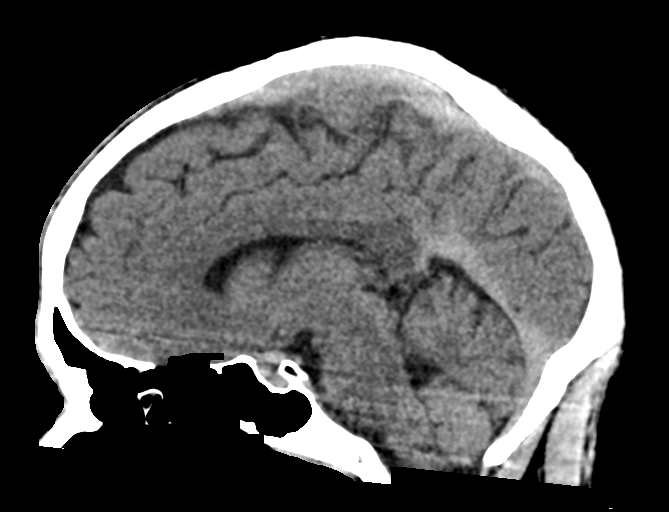
[im 34/51  brain]
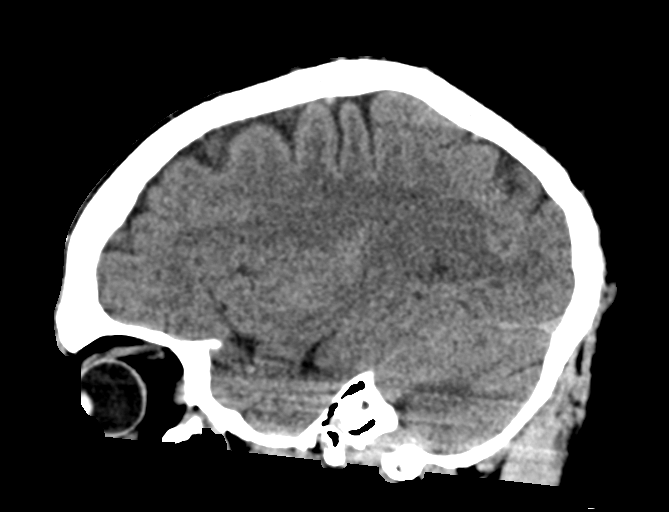

[17 of 47 positions shown; findings below may reference images not displayed]

FINDINGS: Brain: No evidence of acute infarction, hemorrhage, hydrocephalus,
extra-axial collection or mass lesion/mass effect.

A small area of white matter low attenuation (approximately
Hounsfield units) is within the posteromedial aspect of the
temporoparietal region on the right (axial CT images 10 through 12,
CT series 2)

Vascular: No hyperdense vessel or unexpected calcification.

Skull: Normal. Negative for fracture or focal lesion.

Sinuses/Orbits: Mild left ethmoid sinus mucosal thickening is seen.

Other: None.
IMPRESSION: 1. No acute intracranial abnormality.
2. Mild left ethmoid sinus mucosal thickening.
3. Findings of the right temporoparietal region which may represent
a small chronic infarct.

## 2024-05-11 ENCOUNTER — Other Ambulatory Visit: Payer: Self-pay

## 2024-05-11 ENCOUNTER — Emergency Department
Admission: EM | Admit: 2024-05-11 | Discharge: 2024-05-11 | Disposition: A | Discharge status: Home / Self Care | Attending: Emergency Medicine | Admitting: Emergency Medicine

## 2024-05-11 ENCOUNTER — Encounter: Payer: Self-pay | Admitting: Emergency Medicine

## 2024-05-11 DIAGNOSIS — Z760 Encounter for issue of repeat prescription: Secondary | ICD-10-CM | POA: Diagnosis not present

## 2024-05-11 DIAGNOSIS — I10 Essential (primary) hypertension: Secondary | ICD-10-CM | POA: Insufficient documentation

## 2024-05-11 DIAGNOSIS — Z79899 Other long term (current) drug therapy: Secondary | ICD-10-CM | POA: Insufficient documentation

## 2024-05-11 MED ORDER — AMLODIPINE BESYLATE 5 MG PO TABS: 5.0000 mg | ORAL_TABLET | Freq: Every day | ORAL | 11 refills | Status: AC

## 2024-05-11 NOTE — ED Provider Notes (Signed)
 [facility] Provider Note    Event Date/Time   First MD Initiated Contact with Patient 05/11/24 1500     (approximate)   History   Medication refill   HPI  Eduardo King is a 48 y.o. male who reports that he ran out of his amlodipine  prescription.  He typically takes 5 mg of amlodipine .  Review of record demonstrates the patient has gotten many refills from the emergency department.  Discussed with him the need for PCP and he agrees with this.  He feels well overall and has no complaints.  And intermittent headaches 2 days ago, now improved this is what reminded him that it may be important to refill his blood pressure medication     Physical Exam   Triage Vital Signs: ED Triage Vitals  Encounter Vitals Group     BP 05/11/24 1452 (!) 148/112     Girls Systolic BP Percentile --      Girls Diastolic BP Percentile --      Boys Systolic BP Percentile --      Boys Diastolic BP Percentile --      Pulse Rate 05/11/24 1452 69     Resp 05/11/24 1452 18     Temp 05/11/24 1452 98.1 F (36.7 C)     Temp Source 05/11/24 1452 Oral     SpO2 05/11/24 1452 100 %     Weight 05/11/24 1453 68.5 kg (151 lb)     Height 05/11/24 1453 1.88 m (6' 2)     Head Circumference --      Peak Flow --      Pain Score 05/11/24 1453 3     Pain Loc --      Pain Education --      Exclude from Growth Chart --     Most recent vital signs: Vitals:   05/11/24 1452 05/11/24 1518  BP: (!) 148/112 (!) 141/111  Pulse: 69   Resp: 18   Temp: 98.1 F (36.7 C)   SpO2: 100% 100%     General: Awake, no distress.  CV:  Good peripheral perfusion.  Resp:  Normal effort.  Abd:  No distention.  Other:     ED Results / Procedures / Treatments   Labs (all labs ordered are listed, but only abnormal results are displayed) Labs Reviewed - No data to display   EKG     RADIOLOGY     PROCEDURES:  Critical Care performed:   Procedures   MEDICATIONS ORDERED IN  ED: Medications - No data to display   IMPRESSION / MDM / ASSESSMENT AND PLAN / ED COURSE  I reviewed the triage vital signs and the nursing notes. Patient's presentation is most consistent with acute, uncomplicated illness.  Medication refill provided, PCP referral provided        FINAL CLINICAL IMPRESSION(S) / ED DIAGNOSES   Final diagnoses:  Medication refill  Uncontrolled hypertension     Rx / DC Orders   ED Discharge Orders          Ordered    Ambulatory Referral to Primary Care (Establish Care)        05/11/24 1513    amLODipine  (NORVASC ) 5 MG tablet  Daily        05/11/24 1513             Note:  This document was prepared using Dragon voice recognition software and may include unintentional dictation errors.   Arlander Charleston, MD 05/11/24 1534

## 2024-05-11 NOTE — ED Triage Notes (Signed)
 Pt to ER with c/o intermittent headaches for last week.  Pt has hx of HTN but ran out of his medication approx 1 1/2 months ago.  Came to see if the headaches are related to his blood pressure. Denies other c/o.
